# Patient Record
Sex: Male | Born: 1960 | Race: Asian | Hispanic: No | Marital: Married | Smoking: Never smoker
Health system: Southern US, Community
[De-identification: ages and names within clinical notes are randomized; demographics above are authoritative.]

## PROBLEM LIST (undated history)

## (undated) DIAGNOSIS — E785 Hyperlipidemia, unspecified: Secondary | ICD-10-CM

## (undated) DIAGNOSIS — K219 Gastro-esophageal reflux disease without esophagitis: Secondary | ICD-10-CM

## (undated) DIAGNOSIS — M5417 Radiculopathy, lumbosacral region: Secondary | ICD-10-CM

## (undated) DIAGNOSIS — N400 Enlarged prostate without lower urinary tract symptoms: Secondary | ICD-10-CM

## (undated) DIAGNOSIS — E119 Type 2 diabetes mellitus without complications: Secondary | ICD-10-CM

## (undated) DIAGNOSIS — K760 Fatty (change of) liver, not elsewhere classified: Secondary | ICD-10-CM

## (undated) DIAGNOSIS — I10 Essential (primary) hypertension: Secondary | ICD-10-CM

## (undated) DIAGNOSIS — L209 Atopic dermatitis, unspecified: Secondary | ICD-10-CM

---

## 2004-05-16 ENCOUNTER — Emergency Department: Payer: Self-pay | Admitting: General Practice

## 2004-05-16 ENCOUNTER — Other Ambulatory Visit: Payer: Self-pay

## 2010-10-24 ENCOUNTER — Emergency Department: Payer: Self-pay | Admitting: Unknown Physician Specialty

## 2013-04-14 ENCOUNTER — Ambulatory Visit: Payer: Self-pay | Admitting: Gastroenterology

## 2016-03-22 DIAGNOSIS — E785 Hyperlipidemia, unspecified: Secondary | ICD-10-CM

## 2016-03-22 HISTORY — DX: Hyperlipidemia, unspecified: E78.5

## 2016-12-08 ENCOUNTER — Other Ambulatory Visit: Payer: Self-pay | Admitting: Nurse Practitioner

## 2016-12-08 DIAGNOSIS — R140 Abdominal distension (gaseous): Secondary | ICD-10-CM

## 2016-12-08 DIAGNOSIS — R6881 Early satiety: Secondary | ICD-10-CM

## 2016-12-19 ENCOUNTER — Encounter: Admission: RE | Admit: 2016-12-19 | Payer: BLUE CROSS/BLUE SHIELD | Source: Ambulatory Visit

## 2017-01-18 ENCOUNTER — Encounter
Admission: RE | Admit: 2017-01-18 | Discharge: 2017-01-18 | Disposition: A | Discharge status: Home / Self Care | Payer: BLUE CROSS/BLUE SHIELD | Source: Ambulatory Visit | Attending: Nurse Practitioner | Admitting: Nurse Practitioner

## 2017-01-18 DIAGNOSIS — R6881 Early satiety: Secondary | ICD-10-CM | POA: Diagnosis present

## 2017-01-18 DIAGNOSIS — R140 Abdominal distension (gaseous): Secondary | ICD-10-CM | POA: Diagnosis present

## 2017-01-18 MED ORDER — TECHNETIUM TC 99M SULFUR COLLOID: 2.3500 | Freq: Once | INTRAVENOUS | Status: DC | PRN

## 2017-01-18 MED ORDER — TECHNETIUM TC 99M SULFUR COLLOID
2.0000 | Freq: Once | INTRAVENOUS | Status: AC | PRN
Start: 2017-01-18 — End: 2017-01-18
  Administered 2017-01-18: 2.35 via ORAL

## 2017-03-14 ENCOUNTER — Ambulatory Visit: Payer: BLUE CROSS/BLUE SHIELD | Admitting: Anesthesiology

## 2017-03-14 ENCOUNTER — Encounter
Admission: RE | Disposition: A | Discharge status: Home / Self Care | Payer: Self-pay | Source: Ambulatory Visit | Attending: Unknown Physician Specialty

## 2017-03-14 ENCOUNTER — Ambulatory Visit
Admission: RE | Admit: 2017-03-14 | Discharge: 2017-03-14 | Disposition: A | Discharge status: Home / Self Care | Payer: BLUE CROSS/BLUE SHIELD | Source: Ambulatory Visit | Attending: Unknown Physician Specialty | Admitting: Unknown Physician Specialty

## 2017-03-14 DIAGNOSIS — Z79899 Other long term (current) drug therapy: Secondary | ICD-10-CM | POA: Diagnosis not present

## 2017-03-14 DIAGNOSIS — K760 Fatty (change of) liver, not elsewhere classified: Secondary | ICD-10-CM | POA: Diagnosis not present

## 2017-03-14 DIAGNOSIS — K259 Gastric ulcer, unspecified as acute or chronic, without hemorrhage or perforation: Secondary | ICD-10-CM | POA: Diagnosis not present

## 2017-03-14 DIAGNOSIS — K449 Diaphragmatic hernia without obstruction or gangrene: Secondary | ICD-10-CM | POA: Diagnosis not present

## 2017-03-14 DIAGNOSIS — Z7984 Long term (current) use of oral hypoglycemic drugs: Secondary | ICD-10-CM | POA: Diagnosis not present

## 2017-03-14 DIAGNOSIS — E785 Hyperlipidemia, unspecified: Secondary | ICD-10-CM | POA: Diagnosis not present

## 2017-03-14 DIAGNOSIS — R1084 Generalized abdominal pain: Secondary | ICD-10-CM | POA: Diagnosis present

## 2017-03-14 DIAGNOSIS — K219 Gastro-esophageal reflux disease without esophagitis: Secondary | ICD-10-CM | POA: Diagnosis not present

## 2017-03-14 DIAGNOSIS — N400 Enlarged prostate without lower urinary tract symptoms: Secondary | ICD-10-CM | POA: Diagnosis not present

## 2017-03-14 DIAGNOSIS — M5416 Radiculopathy, lumbar region: Secondary | ICD-10-CM | POA: Insufficient documentation

## 2017-03-14 DIAGNOSIS — I10 Essential (primary) hypertension: Secondary | ICD-10-CM | POA: Diagnosis not present

## 2017-03-14 DIAGNOSIS — L308 Other specified dermatitis: Secondary | ICD-10-CM | POA: Diagnosis not present

## 2017-03-14 DIAGNOSIS — K2970 Gastritis, unspecified, without bleeding: Secondary | ICD-10-CM | POA: Insufficient documentation

## 2017-03-14 DIAGNOSIS — E119 Type 2 diabetes mellitus without complications: Secondary | ICD-10-CM | POA: Insufficient documentation

## 2017-03-14 HISTORY — DX: Benign prostatic hyperplasia without lower urinary tract symptoms: N40.0

## 2017-03-14 HISTORY — DX: Essential (primary) hypertension: I10

## 2017-03-14 HISTORY — DX: Gastro-esophageal reflux disease without esophagitis: K21.9

## 2017-03-14 HISTORY — DX: Fatty (change of) liver, not elsewhere classified: K76.0

## 2017-03-14 HISTORY — DX: Atopic dermatitis, unspecified: L20.9

## 2017-03-14 HISTORY — DX: Type 2 diabetes mellitus without complications: E11.9

## 2017-03-14 HISTORY — PX: ESOPHAGOGASTRODUODENOSCOPY (EGD) WITH PROPOFOL: SHX5813

## 2017-03-14 HISTORY — DX: Hyperlipidemia, unspecified: E78.5

## 2017-03-14 HISTORY — DX: Radiculopathy, lumbosacral region: M54.17

## 2017-03-14 LAB — GLUCOSE, CAPILLARY: GLUCOSE-CAPILLARY: 133 mg/dL — AB (ref 65–99)

## 2017-03-14 SURGERY — ESOPHAGOGASTRODUODENOSCOPY (EGD) WITH PROPOFOL
Anesthesia: General

## 2017-03-14 MED ORDER — LIDOCAINE HCL (PF) 2 % IJ SOLN
INTRAMUSCULAR | Status: AC
  Filled 2017-03-14: qty 2

## 2017-03-14 MED ORDER — GLYCOPYRROLATE 0.2 MG/ML IJ SOLN
INTRAMUSCULAR | Status: AC
  Filled 2017-03-14: qty 1

## 2017-03-14 MED ORDER — PROPOFOL 10 MG/ML IV BOLUS
INTRAVENOUS | Status: AC
  Filled 2017-03-14: qty 20

## 2017-03-14 MED ORDER — PROPOFOL 500 MG/50ML IV EMUL
INTRAVENOUS | Status: DC | PRN
  Administered 2017-03-14: 75 ug/kg/min via INTRAVENOUS

## 2017-03-14 MED ORDER — SODIUM CHLORIDE 0.9 % IV SOLN
INTRAVENOUS | Status: DC
  Administered 2017-03-14: 1000 mL via INTRAVENOUS

## 2017-03-14 MED ORDER — GLYCOPYRROLATE 0.2 MG/ML IJ SOLN
INTRAMUSCULAR | Status: DC | PRN
  Administered 2017-03-14: 0.2 mg via INTRAVENOUS

## 2017-03-14 MED ORDER — LIDOCAINE HCL (PF) 2 % IJ SOLN
INTRAMUSCULAR | Status: DC | PRN
  Administered 2017-03-14: 50 mg

## 2017-03-14 MED ORDER — MIDAZOLAM HCL 2 MG/2ML IJ SOLN
INTRAMUSCULAR | Status: AC
  Filled 2017-03-14: qty 2

## 2017-03-14 MED ORDER — SODIUM CHLORIDE 0.9 % IV SOLN: INTRAVENOUS | Status: DC

## 2017-03-14 MED ORDER — MIDAZOLAM HCL 5 MG/5ML IJ SOLN
INTRAMUSCULAR | Status: DC | PRN
  Administered 2017-03-14: 2 mg via INTRAVENOUS

## 2017-03-14 MED ORDER — FENTANYL CITRATE (PF) 100 MCG/2ML IJ SOLN
INTRAMUSCULAR | Status: DC | PRN
  Administered 2017-03-14 (×2): 50 ug via INTRAVENOUS

## 2017-03-14 MED ORDER — PROPOFOL 10 MG/ML IV BOLUS
INTRAVENOUS | Status: DC | PRN
  Administered 2017-03-14: 50 mg via INTRAVENOUS

## 2017-03-14 MED ORDER — FENTANYL CITRATE (PF) 100 MCG/2ML IJ SOLN
INTRAMUSCULAR | Status: AC
  Filled 2017-03-14: qty 2

## 2017-03-14 NOTE — Anesthesia Preprocedure Evaluation (Signed)
Anesthesia Evaluation  Patient identified by MRN, date of birth, ID band Patient awake    Reviewed: Allergy & Precautions, NPO status , Patient's Chart, lab work & pertinent test results  History of Anesthesia Complications Negative for: history of anesthetic complications  Airway Mallampati: II       Dental   Pulmonary neg COPD,           Cardiovascular hypertension, Pt. on medications      Neuro/Psych neg Seizures    GI/Hepatic Neg liver ROS, GERD  Medicated,  Endo/Other  diabetes, Type 2, Oral Hypoglycemic Agents  Renal/GU negative Renal ROS     Musculoskeletal   Abdominal   Peds  Hematology   Anesthesia Other Findings   Reproductive/Obstetrics                             Anesthesia Physical Anesthesia Plan  ASA: III  Anesthesia Plan: General   Post-op Pain Management:    Induction:   PONV Risk Score and Plan:   Airway Management Planned:   Additional Equipment:   Intra-op Plan:   Post-operative Plan:   Informed Consent: I have reviewed the patients History and Physical, chart, labs and discussed the procedure including the risks, benefits and alternatives for the proposed anesthesia with the patient or authorized representative who has indicated his/her understanding and acceptance.     Plan Discussed with:   Anesthesia Plan Comments:         Anesthesia Quick Evaluation

## 2017-03-14 NOTE — Transfer of Care (Signed)
Immediate Anesthesia Transfer of Care Note  Patient: Marbin Remick  Procedure(s) Performed: Procedure(s): ESOPHAGOGASTRODUODENOSCOPY (EGD) WITH PROPOFOL (N/A)  Patient Location: PACU  Anesthesia Type:General  Level of Consciousness: sedated  Airway & Oxygen Therapy: Patient Spontanous Breathing and Patient connected to nasal cannula oxygen  Post-op Assessment: Report given to RN and Post -op Vital signs reviewed and stable  Post vital signs: Reviewed and stable  Last Vitals:  Vitals:   03/14/17 0814  BP: (!) 151/90  Pulse: (!) 58  Resp: 16  Temp: (!) 36.2 C  SpO2: 100%    Last Pain:  Vitals:   03/14/17 0814  TempSrc: Tympanic         Complications: No apparent anesthesia complications

## 2017-03-14 NOTE — Anesthesia Postprocedure Evaluation (Signed)
Anesthesia Post Note  Patient: Cezar Croker  Procedure(s) Performed: Procedure(s) (LRB): ESOPHAGOGASTRODUODENOSCOPY (EGD) WITH PROPOFOL (N/A)  Patient location during evaluation: Endoscopy Anesthesia Type: General Level of consciousness: awake and alert Pain management: pain level controlled Vital Signs Assessment: post-procedure vital signs reviewed and stable Respiratory status: spontaneous breathing and respiratory function stable Cardiovascular status: stable Anesthetic complications: no     Last Vitals:  Vitals:   03/14/17 0814  BP: (!) 151/90  Pulse: (!) 58  Resp: 16  Temp: (!) 36.2 C  SpO2: 100%    Last Pain:  Vitals:   03/14/17 0814  TempSrc: Tympanic                 KEPHART,WILLIAM K

## 2017-03-14 NOTE — Anesthesia Post-op Follow-up Note (Signed)
Anesthesia QCDR form completed.        

## 2017-03-14 NOTE — H&P (Signed)
   Primary Care Physician:  [facility] Primary Gastroenterologist:  Dr. Mechele CollinElliott  Pre-Procedure History & Physical: HPI:  Robert Avery is a 56 y.o. male is here for an endoscopy.   Past Medical History:  Diagnosis Date  . Atopic dermatitis   . BPH (benign prostatic hyperplasia)   . Diabetes mellitus without complication (HCC)   . Fatty liver   . GERD (gastroesophageal reflux disease)   . Hyperlipidemia 03/22/2016  . Hypertension   . Lumbosacral radiculopathy at L4     No past surgical history on file.  Prior to Admission medications   Medication Sig Start Date End Date Taking? Authorizing Provider  metFORMIN (GLUCOPHAGE) 500 MG tablet Take by mouth 2 (two) times daily with a meal.   Yes [provider]  pantoprazole (PROTONIX) 40 MG tablet Take 40 mg by mouth 2 (two) times daily.   Yes [provider]  rosuvastatin (CRESTOR) 5 MG tablet Take 5 mg by mouth daily.   Yes [provider]  valsartan-hydrochlorothiazide (DIOVAN-HCT) 320-25 MG tablet Take 1 tablet by mouth daily.   Yes [provider]    Allergies as of 12/28/2016  . (Not on File)    No family history on file.  Social History   Social History  . Marital status: Married    Spouse name: N/A  . Number of children: N/A  . Years of education: N/A   Occupational History  . Not on file.   Social History Main Topics  . Smoking status: Never Smoker  . Smokeless tobacco: Never Used  . Alcohol use No  . Drug use: No  . Sexual activity: Not on file   Other Topics Concern  . Not on file   Social History Narrative  . No narrative on file    Review of Systems: See HPI, otherwise negative ROS  Physical Exam: BP (!) 151/90   Pulse (!) 58   Temp (!) 97.1 F (36.2 C) (Tympanic)   Resp 16   Ht 5\' 7"  (1.702 m)   Wt 78 kg (172 lb)   SpO2 100%   BMI 26.94 kg/m  General:   Alert,  pleasant and cooperative in NAD Head:  Normocephalic and atraumatic. Neck:   Supple; no masses or thyromegaly. Lungs:  Clear throughout to auscultation.    Heart:  Regular rate and rhythm. Abdomen:  Soft, nontender and nondistended. Normal bowel sounds, without guarding, and without rebound.   Neurologic:  Alert and  oriented x4;  grossly normal neurologically.  Impression/Plan: Robert Avery is here for an endoscopy to be performed for epigastric abd pain.  Risks, benefits, limitations, and alternatives regarding  endoscopy have been reviewed with the patient.  Questions have been answered.  All parties agreeable.   Lynnae PrudeELLIOTT, [name], MD  03/14/2017, 8:36 AM

## 2017-03-14 NOTE — Op Note (Signed)
[facility] Gastroenterology Patient Name: Robert Avery Procedure Date: 03/14/2017 8:37 AM MRN: [id number] Account #: [id number] Date of Birth: 01/12/1961 Admit Type: Outpatient Age: [age] [facility] 3 Gender: Male Note Status: Finalized Procedure:            Upper GI endoscopy Indications:          Epigastric abdominal pain, Generalized abdominal pain Providers:            Scot Junobert T. Elliott, MD Referring MD:         No Local Md, MD (Referring MD) Medicines:            Propofol per Anesthesia Complications:        No immediate complications. Procedure:            Pre-Anesthesia Assessment:                       - After reviewing the risks and benefits, the patient                        was deemed in satisfactory condition to undergo the                        procedure.                       After obtaining informed consent, the endoscope was                        passed under direct vision. Throughout the procedure,                        the patient's blood pressure, pulse, and oxygen                        saturations were monitored continuously. The Endoscope                        was introduced through the mouth, and advanced to the                        second part of duodenum. The upper GI endoscopy was                        accomplished without difficulty. The patient tolerated                        the procedure well. Findings:      A small hiatal hernia was present. Biopsies were taken with a cold       forceps for histology.      Diffuse and patchy mild inflammation characterized by erythema was found       in the gastric antrum. Biopsies were taken with a cold forceps for       histology. Biopsies were taken with a cold forceps for Helicobacter       pylori testing.      Localized minimal inflammation characterized by erythema and granularity       was found in the gastric body. Biopsies were taken with a cold forceps       for  histology. Biopsies were taken with a cold forceps for Helicobacter  pylori testing.      The examined duodenum was normal. Impression:           - Small hiatal hernia. Biopsied.                       - Gastritis. Biopsied.                       - Gastritis. Biopsied.                       - Normal examined duodenum. Recommendation:       - Await pathology results. Scot Jun, MD 03/14/2017 8:52:19 AM This report has been signed electronically. Number of Addenda: 0 Note Initiated On: 03/14/2017 8:37 AM      [facility]

## 2017-03-15 ENCOUNTER — Encounter: Payer: Self-pay | Admitting: Unknown Physician Specialty

## 2017-03-16 LAB — SURGICAL PATHOLOGY

## 2017-03-22 ENCOUNTER — Other Ambulatory Visit: Payer: Self-pay | Admitting: Nurse Practitioner

## 2017-03-22 DIAGNOSIS — R1013 Epigastric pain: Secondary | ICD-10-CM

## 2017-03-22 DIAGNOSIS — R140 Abdominal distension (gaseous): Secondary | ICD-10-CM

## 2017-03-28 ENCOUNTER — Ambulatory Visit: Admission: RE | Admit: 2017-03-28 | Payer: BLUE CROSS/BLUE SHIELD | Source: Ambulatory Visit

## 2017-04-05 ENCOUNTER — Ambulatory Visit
Admission: RE | Admit: 2017-04-05 | Discharge: 2017-04-05 | Disposition: A | Discharge status: Home / Self Care | Payer: BLUE CROSS/BLUE SHIELD | Source: Ambulatory Visit | Attending: Nurse Practitioner | Admitting: Nurse Practitioner

## 2017-04-05 ENCOUNTER — Ambulatory Visit: Admission: RE | Admit: 2017-04-05 | Payer: BLUE CROSS/BLUE SHIELD | Source: Ambulatory Visit

## 2017-04-05 DIAGNOSIS — K219 Gastro-esophageal reflux disease without esophagitis: Secondary | ICD-10-CM | POA: Diagnosis present

## 2017-04-05 DIAGNOSIS — R1013 Epigastric pain: Secondary | ICD-10-CM | POA: Insufficient documentation

## 2017-04-05 DIAGNOSIS — R160 Hepatomegaly, not elsewhere classified: Secondary | ICD-10-CM | POA: Diagnosis not present

## 2017-04-05 DIAGNOSIS — R140 Abdominal distension (gaseous): Secondary | ICD-10-CM | POA: Diagnosis present

## 2017-04-05 DIAGNOSIS — I700 Atherosclerosis of aorta: Secondary | ICD-10-CM | POA: Insufficient documentation

## 2017-04-05 DIAGNOSIS — N400 Enlarged prostate without lower urinary tract symptoms: Secondary | ICD-10-CM | POA: Diagnosis not present

## 2017-04-05 MED ORDER — IOPAMIDOL (ISOVUE-300) INJECTION 61%
100.0000 mL | Freq: Once | INTRAVENOUS | Status: AC | PRN
  Administered 2017-04-05: 100 mL via INTRAVENOUS

## 2017-06-15 ENCOUNTER — Encounter: Payer: Self-pay | Admitting: Urology

## 2017-06-15 ENCOUNTER — Ambulatory Visit: Payer: Self-pay | Admitting: Urology

## 2017-12-13 IMAGING — NM NM GASTRIC EMPTYING
1 series · 10 of 10 positions shown · non-contrast
Comparison: None

CLINICAL DATA: Early satiety, postprandial bloating

EXAM:
NUCLEAR MEDICINE GASTRIC EMPTYING SCAN
TECHNIQUE: After oral ingestion of radiolabeled meal, sequential abdominal
images were obtained for 4 hours. Percentage of activity emptying
the stomach was calculated at 1 hour, 2 hour, 3 hour, and 4 hours.
RADIOPHARMACEUTICALS:  2.35 mCi Ec-PPm sulfur colloid in
standardized meal

[Series 1000: gatric statics (results) · 3.90mm/px · 5 acquisitions, 10 frames shown]
[im 1/5]
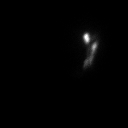
[im 1/5]
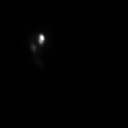
[im 2/5]
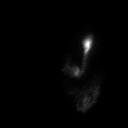
[im 2/5]
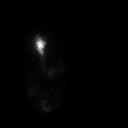
[im 3/5]
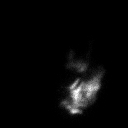
[im 3/5]
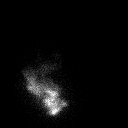
[im 4/5]
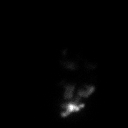
[im 4/5]
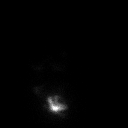
[im 5/5]
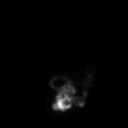
[im 5/5]
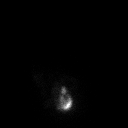

[10 of 10 positions shown; findings below may reference images not displayed]

FINDINGS: Expected location of the stomach in the left upper quadrant.

Ingested meal empties the stomach gradually over the course of the
study.

27% emptied at 1 hr ( normal >= 10%)

91% emptied at 2 hr ( normal >= 40%)

95% emptied at 3 hr ( normal >= 70%)

100% emptied at 4 hr ( normal >= 90%)
IMPRESSION: Normal gastric emptying.

## 2018-04-07 ENCOUNTER — Emergency Department
Admission: EM | Admit: 2018-04-07 | Discharge: 2018-04-07 | Disposition: A | Discharge status: Home / Self Care | Payer: BLUE CROSS/BLUE SHIELD | Attending: Emergency Medicine | Admitting: Emergency Medicine

## 2018-04-07 ENCOUNTER — Emergency Department: Payer: BLUE CROSS/BLUE SHIELD

## 2018-04-07 ENCOUNTER — Other Ambulatory Visit: Payer: Self-pay

## 2018-04-07 ENCOUNTER — Encounter: Payer: Self-pay | Admitting: Emergency Medicine

## 2018-04-07 DIAGNOSIS — Y990 Civilian activity done for income or pay: Secondary | ICD-10-CM | POA: Diagnosis not present

## 2018-04-07 DIAGNOSIS — S66822A Laceration of other specified muscles, fascia and tendons at wrist and hand level, left hand, initial encounter: Secondary | ICD-10-CM | POA: Insufficient documentation

## 2018-04-07 DIAGNOSIS — Y9259 Other trade areas as the place of occurrence of the external cause: Secondary | ICD-10-CM | POA: Insufficient documentation

## 2018-04-07 DIAGNOSIS — S66812A Strain of other specified muscles, fascia and tendons at wrist and hand level, left hand, initial encounter: Secondary | ICD-10-CM

## 2018-04-07 DIAGNOSIS — E119 Type 2 diabetes mellitus without complications: Secondary | ICD-10-CM | POA: Diagnosis not present

## 2018-04-07 DIAGNOSIS — I10 Essential (primary) hypertension: Secondary | ICD-10-CM | POA: Diagnosis not present

## 2018-04-07 DIAGNOSIS — W208XXA Other cause of strike by thrown, projected or falling object, initial encounter: Secondary | ICD-10-CM | POA: Diagnosis not present

## 2018-04-07 DIAGNOSIS — Z23 Encounter for immunization: Secondary | ICD-10-CM | POA: Insufficient documentation

## 2018-04-07 DIAGNOSIS — Z79899 Other long term (current) drug therapy: Secondary | ICD-10-CM | POA: Diagnosis not present

## 2018-04-07 DIAGNOSIS — Y93E9 Activity, other interior property and clothing maintenance: Secondary | ICD-10-CM | POA: Diagnosis not present

## 2018-04-07 DIAGNOSIS — Z7984 Long term (current) use of oral hypoglycemic drugs: Secondary | ICD-10-CM | POA: Diagnosis not present

## 2018-04-07 DIAGNOSIS — S61412A Laceration without foreign body of left hand, initial encounter: Secondary | ICD-10-CM | POA: Diagnosis present

## 2018-04-07 MED ORDER — MORPHINE SULFATE (PF) 4 MG/ML IV SOLN
4.0000 mg | Freq: Once | INTRAVENOUS | Status: AC
  Administered 2018-04-07: 4 mg via INTRAVENOUS
  Filled 2018-04-07: qty 1

## 2018-04-07 MED ORDER — LIDOCAINE-EPINEPHRINE (PF) 1 %-1:200000 IJ SOLN
10.0000 mL | Freq: Once | INTRAMUSCULAR | Status: DC
  Filled 2018-04-07: qty 10

## 2018-04-07 MED ORDER — MORPHINE SULFATE (PF) 4 MG/ML IV SOLN
INTRAVENOUS | Status: AC
  Filled 2018-04-07: qty 1

## 2018-04-07 MED ORDER — MORPHINE SULFATE (PF) 4 MG/ML IV SOLN: 4.0000 mg | Freq: Once | INTRAVENOUS | Status: DC

## 2018-04-07 MED ORDER — TETANUS-DIPHTH-ACELL PERTUSSIS 5-2.5-18.5 LF-MCG/0.5 IM SUSP
INTRAMUSCULAR | Status: AC
  Filled 2018-04-07: qty 0.5

## 2018-04-07 MED ORDER — TRAMADOL HCL 50 MG PO TABS: 50.0000 mg | ORAL_TABLET | Freq: Four times a day (QID) | ORAL | 0 refills | Status: AC | PRN

## 2018-04-07 MED ORDER — TETANUS-DIPHTH-ACELL PERTUSSIS 5-2.5-18.5 LF-MCG/0.5 IM SUSP
0.5000 mL | Freq: Once | INTRAMUSCULAR | Status: AC
  Administered 2018-04-07: 0.5 mL via INTRAMUSCULAR
  Filled 2018-04-07: qty 0.5

## 2018-04-07 MED ORDER — LIDOCAINE-EPINEPHRINE 1 %-1:100000 IJ SOLN
INTRAMUSCULAR | Status: AC
  Filled 2018-04-07: qty 1

## 2018-04-07 MED ORDER — TRAMADOL HCL 50 MG PO TABS
50.0000 mg | ORAL_TABLET | Freq: Once | ORAL | Status: AC
  Administered 2018-04-07: 50 mg via ORAL
  Filled 2018-04-07: qty 1

## 2018-04-07 MED ORDER — ONDANSETRON HCL 4 MG/2ML IJ SOLN
INTRAMUSCULAR | Status: AC
  Filled 2018-04-07: qty 2

## 2018-04-07 MED ORDER — ONDANSETRON HCL 4 MG/2ML IJ SOLN
4.0000 mg | Freq: Once | INTRAMUSCULAR | Status: AC
  Administered 2018-04-07: 4 mg via INTRAVENOUS
  Filled 2018-04-07: qty 2

## 2018-04-07 NOTE — ED Provider Notes (Signed)
[facility] EMERGENCY DEPARTMENT Provider Note   CSN: [id number] Arrival date & time: 04/07/18  1529     History   Chief Complaint Chief Complaint  Patient presents with  . Arm Injury    HPI Robert Avery is a 57 y.o. male.  Presents to the emergency department for a workers comp injury to the left hand.  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  Patient denies any numbness or tingling but is unable to extend the left thumb.  His pain is moderate.  Tetanus status is unknown.  He is right-hand dominant.  Patient denies any other injury to his body.  Denies falling, hitting his head or losing consciousness.  HPI  Past Medical History:  Diagnosis Date  . Atopic dermatitis   . BPH (benign prostatic hyperplasia)   . Diabetes mellitus without complication (HCC)    Pt takes Metformin  . Fatty liver   . GERD (gastroesophageal reflux disease)   . Hyperlipidemia 03/22/2016  . Hypertension   . Lumbosacral radiculopathy at L4     There are no active problems to display for this patient.   Past Surgical History:  Procedure Laterality Date  . ESOPHAGOGASTRODUODENOSCOPY (EGD) WITH PROPOFOL N/A 03/14/2017   Procedure: ESOPHAGOGASTRODUODENOSCOPY (EGD) WITH PROPOFOL;  Surgeon: Scot Jun, MD;  Location: [facility] ENDOSCOPY;  Service: Endoscopy;  Laterality: N/A;        Home Medications    Prior to Admission medications   Medication Sig Start Date End Date Taking? Authorizing Provider  metFORMIN (GLUCOPHAGE) 500 MG tablet Take by mouth 2 (two) times daily with a meal.    [provider]  pantoprazole (PROTONIX) 40 MG tablet Take 40 mg by mouth 2 (two) times daily.    [provider]  rosuvastatin (CRESTOR) 5 MG tablet Take 5 mg by mouth daily.    [provider]  traMADol (ULTRAM) 50 MG tablet Take 1 tablet (50 mg  total) by mouth every 6 (six) hours as needed. 04/07/18 04/07/19  Evon Slack, PA-C  valsartan-hydrochlorothiazide (DIOVAN-HCT) 320-25 MG tablet Take 1 tablet by mouth daily.    [provider]    Family History No family history on file.  Social History Social History   Tobacco Use  . Smoking status: Never Smoker  . Smokeless tobacco: Never Used  Substance Use Topics  . Alcohol use: No  . Drug use: No     Allergies   Patient has no known allergies.   Review of Systems Review of Systems  Constitutional: Positive for fever.  Respiratory: Negative for shortness of breath.   Cardiovascular: Negative for chest pain.  Musculoskeletal: Positive for arthralgias and myalgias. Negative for gait problem, joint swelling and neck pain.  Skin: Positive for wound.  Neurological: Negative for numbness and headaches.     Physical Exam Updated Vital Signs BP (!) 169/96 (BP Location: Right Arm)   Pulse 76   Resp 14   SpO2 99%   Physical Exam  Constitutional: He is oriented to person, place, and time. He appears well-developed and well-nourished.  HENT:  Head: Normocephalic and atraumatic.  Eyes: Conjunctivae are normal.  Neck: Normal range of motion.  Cardiovascular: Normal rate.  Pulmonary/Chest: Effort normal. No respiratory distress.  Musculoskeletal:  Examination of the left hand shows patient has a 3 cm laceration along the radial styloid of the left  wrist extending towards the dorsal aspect of the radiocarpal joint.  No visible or palpable foreign body.  Distal portion of the extensor pollicis tendon is visualized, unable to visualized more proximal portion.  Laceration is linear.  Sensation is intact distally.  No other tendon deficits noted.  Neurological: He is alert and oriented to person, place, and time.  Skin: Skin is warm. No rash noted.  Psychiatric: He has a normal mood and affect. His behavior is normal. Thought content normal.     ED Treatments /  Results  Labs (all labs ordered are listed, but only abnormal results are displayed) Labs Reviewed - No data to display  EKG None  Radiology Dg Hand Complete Left  Result Date: 04/07/2018 CLINICAL DATA:  Metal she fell onto left hand. Left hand pain and laceration. Initial encounter. EXAM: LEFT HAND - COMPLETE 3+ VIEW COMPARISON:  None. FINDINGS: There is no evidence of fracture or dislocation. There is no evidence of arthropathy or other focal bone abnormality. No evidence of radiopaque foreign body. IMPRESSION: No evidence of fracture or radiopaque foreign body. Electronically Signed   By: Myles Rosenthal M.D.   On: 04/07/2018 16:18    Procedures .Marland KitchenLaceration Repair Date/Time: 04/07/2018 5:05 PM Performed by: Evon Slack, PA-C Authorized by: Evon Slack, PA-C   Consent:    Consent obtained:  Verbal   Consent given by:  Patient   Risks discussed:  Infection and tendon damage   Alternatives discussed:  No treatment Anesthesia (see MAR for exact dosages):    Anesthesia method:  Local infiltration   Local anesthetic:  Lidocaine 1% WITH epi Laceration details:    Location:  Hand   Hand location:  L wrist   Length (cm):  3   Depth (mm):  3 Repair type:    Repair type:  Simple Pre-procedure details:    Preparation:  Patient was prepped and draped in usual sterile fashion Exploration:    Wound exploration: wound explored through full range of motion and entire depth of wound probed and visualized     Wound extent: tendon damage     Tendon damage location:  Upper extremity   Tendon damage extent:  Complete transection   Tendon repair plan:  Refer for evaluation   Contaminated: no (Extensor pollicis tendon left thumb)   Treatment:    Area cleansed with:  Betadine and saline   Amount of cleaning:  Extensive   Irrigation volume:  120   Irrigation method:  Pressure wash Skin repair:    Repair method:  Sutures   Suture size:  5-0   Suture material:  Nylon Approximation:     Approximation:  Close Post-procedure details:    Dressing:  Non-adherent dressing, bulky dressing and splint for protection   Patient tolerance of procedure:  Tolerated well, no immediate complications .Splint Application Date/Time: 04/07/2018 5:07 PM Performed by: Evon Slack, PA-C Authorized by: Evon Slack, PA-C   Consent:    Consent obtained:  Verbal   Consent given by:  Patient   Risks discussed:  Numbness, pain and swelling Pre-procedure details:    Sensation:  Normal Procedure details:    Laterality:  Left   Location:  Wrist   Cast type:  Short arm   Splint type:  Thumb spica   Supplies:  Elastic bandage, Ortho-Glass and cotton padding Post-procedure details:    Pain:  Unchanged   Sensation:  Normal   Patient tolerance of procedure:  Tolerated well, no immediate complications   (  including critical care time)  Medications Ordered in ED Medications  lidocaine-EPINEPHrine (XYLOCAINE-EPINEPHrine) 1 %-1:200000 (PF) injection 10 mL (has no administration in time range)  morphine 4 MG/ML injection (has no administration in time range)  ondansetron (ZOFRAN) 4 MG/2ML injection (has no administration in time range)  Tdap (BOOSTRIX) 5-2.5-18.5 LF-MCG/0.5 injection (has no administration in time range)  lidocaine-EPINEPHrine (XYLOCAINE W/EPI) 1 %-1:100000 (with pres) injection (has no administration in time range)  ondansetron (ZOFRAN) injection 4 mg (4 mg Intravenous Given 04/07/18 1550)  Tdap (BOOSTRIX) injection 0.5 mL (0.5 mLs Intramuscular Given 04/07/18 1551)  morphine 4 MG/ML injection 4 mg (4 mg Intravenous Given 04/07/18 1550)     Initial Impression / Assessment and Plan / ED Course  I have reviewed the triage vital signs and the nursing notes.  Pertinent labs & imaging results that were available during my care of the patient were reviewed by me and considered in my medical decision making (see chart for details).     57 year old male with laceration to the  left wrist with evidence of extensor pollicis tendon rupture.  Discussed with orthopedics who recommend following up with hand surgeon Dr.Soria.  Laceration site is thoroughly irrigated and repaired with sutures and patient placed into a thumb spica splint.  Patient given tramadol for moderate to severe pain.  Final Clinical Impressions(s) / ED Diagnoses   Final diagnoses:  Laceration of left hand without foreign body, initial encounter  Extensor tendon rupture of hand, left, initial encounter    ED Discharge Orders         Ordered    traMADol (ULTRAM) 50 MG tablet  Every 6 hours PRN     04/07/18 1652           Evon Slack, PA-C 04/07/18 1709    Emily Filbert, MD 04/07/18 1740

## 2018-04-07 NOTE — Discharge Instructions (Addendum)
Today you suffered a laceration to the extensor tendon of your left thumb.  X-ray showed no foreign bodies and no broken bones.  Please call hand surgeon tomorrow to schedule a follow-up appointment.  Keep splint on at all times and do not get wet.  Take Tylenol and ibuprofen as needed for mild to moderate pain.  If pain is severe meet you may take tramadol as prescribed.

## 2018-04-07 NOTE — ED Triage Notes (Addendum)
PT c/o lac to LFT hand after metal sheet fell onto lft hand./ Per first RN pressure applied in lobby and sent back to room. Bleeding controlled, open wound noted, unable to extend thumb. Provider at bedside

## 2019-02-20 ENCOUNTER — Other Ambulatory Visit: Payer: Self-pay

## 2019-02-20 ENCOUNTER — Encounter: Payer: Self-pay | Admitting: Emergency Medicine

## 2019-02-20 DIAGNOSIS — I10 Essential (primary) hypertension: Secondary | ICD-10-CM | POA: Diagnosis not present

## 2019-02-20 DIAGNOSIS — Y939 Activity, unspecified: Secondary | ICD-10-CM | POA: Insufficient documentation

## 2019-02-20 DIAGNOSIS — Y999 Unspecified external cause status: Secondary | ICD-10-CM | POA: Insufficient documentation

## 2019-02-20 DIAGNOSIS — X102XXA Contact with fats and cooking oils, initial encounter: Secondary | ICD-10-CM | POA: Diagnosis not present

## 2019-02-20 DIAGNOSIS — T25211A Burn of second degree of right ankle, initial encounter: Secondary | ICD-10-CM | POA: Insufficient documentation

## 2019-02-20 DIAGNOSIS — T25011A Burn of unspecified degree of right ankle, initial encounter: Secondary | ICD-10-CM | POA: Diagnosis present

## 2019-02-20 DIAGNOSIS — Z7984 Long term (current) use of oral hypoglycemic drugs: Secondary | ICD-10-CM | POA: Diagnosis not present

## 2019-02-20 DIAGNOSIS — Y929 Unspecified place or not applicable: Secondary | ICD-10-CM | POA: Insufficient documentation

## 2019-02-20 DIAGNOSIS — Z79899 Other long term (current) drug therapy: Secondary | ICD-10-CM | POA: Diagnosis not present

## 2019-02-20 DIAGNOSIS — E119 Type 2 diabetes mellitus without complications: Secondary | ICD-10-CM | POA: Insufficient documentation
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
## 2019-02-21 ENCOUNTER — Emergency Department
Admission: EM | Admit: 2019-02-21 | Discharge: 2019-02-21 | Disposition: A | Discharge status: Home / Self Care | Payer: BLUE CROSS/BLUE SHIELD | Attending: Emergency Medicine | Admitting: Emergency Medicine

## 2019-02-21 DIAGNOSIS — T25211A Burn of second degree of right ankle, initial encounter: Secondary | ICD-10-CM

## 2019-02-21 MED ORDER — IBUPROFEN 600 MG PO TABS
600.0000 mg | ORAL_TABLET | Freq: Once | ORAL | Status: AC
  Administered 2019-02-21: 600 mg via ORAL
  Filled 2019-02-21: qty 1

## 2019-02-21 MED ORDER — SILVER SULFADIAZINE 1 % EX CREA: TOPICAL_CREAM | CUTANEOUS | 1 refills | Status: AC

## 2019-02-21 MED ORDER — OXYCODONE-ACETAMINOPHEN 5-325 MG PO TABS: 1.0000 | ORAL_TABLET | ORAL | 0 refills | Status: DC | PRN

## 2019-02-21 NOTE — ED Notes (Signed)
Dry dressing applied to ankle per md verbal order.  meds given.
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
## 2019-02-21 NOTE — ED Provider Notes (Signed)
[facility] Emergency Department Provider Note  ____________________________________________  Time seen: Approximately 1:59 AM  I have reviewed the triage vital signs and the nursing notes.   HISTORY  Chief Complaint Burn   HPI Mitesh Avery is a 58 y.o. male With history as listed below presents for evaluation of burn to the right ankle.  Patient reports dropping hot oil on his ankle 2 days ago.  Patient reports that a blister formed immediately however that is getting bigger.  He is complaining of 6 out of 10 burning constant pain that is worse with weightbearing.  No other burns.  Last tetanus shot was in 2019.  Past Medical History:  Diagnosis Date  . Atopic dermatitis   . BPH (benign prostatic hyperplasia)   . Diabetes mellitus without complication ([place])    Pt takes Metformin  . Fatty liver   . GERD (gastroesophageal reflux disease)   . Hyperlipidemia 03/22/2016  . Hypertension   . Lumbosacral radiculopathy at L4     There are no active problems to display for this patient.   Past Surgical History:  Procedure Laterality Date  . ESOPHAGOGASTRODUODENOSCOPY (EGD) WITH PROPOFOL N/A 03/14/2017   Procedure: ESOPHAGOGASTRODUODENOSCOPY (EGD) WITH PROPOFOL;  Surgeon: Manya Silvas, MD;  Location: [facility] ENDOSCOPY;  Service: Endoscopy;  Laterality: N/A;    Prior to Admission medications   Medication Sig Start Date End Date Taking? Authorizing Provider  metFORMIN (GLUCOPHAGE) 500 MG tablet Take by mouth 2 (two) times daily with a meal.    [provider]  oxyCODONE-acetaminophen (PERCOCET) 5-325 MG tablet Take 1 tablet by mouth every 4 (four) hours as needed. 02/21/19   Rudene Re, MD  pantoprazole (PROTONIX) 40 MG tablet Take 40 mg by mouth 2 (two) times daily.    [provider]  rosuvastatin (CRESTOR) 5 MG tablet Take 5 mg by mouth daily.    [provider]  silver sulfADIAZINE (SILVADENE) 1 % cream Apply to  affected area daily 02/21/19 02/21/20  Alfred Levins, [place], MD  traMADol (ULTRAM) 50 MG tablet Take 1 tablet (50 mg total) by mouth every 6 (six) hours as needed. 04/07/18 04/07/19  Duanne Guess, PA-C  valsartan-hydrochlorothiazide (DIOVAN-HCT) 320-25 MG tablet Take 1 tablet by mouth daily.    [provider]    Allergies Patient has no known allergies.  No family history on file.  Social History Social History   Tobacco Use  . Smoking status: Never Smoker  . Smokeless tobacco: Never Used  Substance Use Topics  . Alcohol use: No  . Drug use: No    Review of Systems  Constitutional: Negative for fever. Eyes: Negative for visual changes. ENT: Negative for sore throat. Neck: No neck pain  Cardiovascular: Negative for chest pain. Respiratory: Negative for shortness of breath. Gastrointestinal: Negative for abdominal pain, vomiting or diarrhea. Genitourinary: Negative for dysuria. Musculoskeletal: Negative for back pain. Skin: Negative for rash. + burn to ankle Neurological: Negative for headaches, weakness or numbness. Psych: No SI or HI  ____________________________________________   PHYSICAL EXAM:  VITAL SIGNS: ED Triage Vitals  Enc Vitals Group     BP 02/20/19 2316 (!) 151/82     Pulse Rate 02/20/19 2316 75     Resp 02/20/19 2316 18     Temp 02/20/19 2316 98.6 F (37 C)     Temp Source 02/20/19 2316 Oral     SpO2 02/20/19 2316 96 %     Weight 02/20/19 2314 168 lb (76.2 kg)  Height 02/20/19 2314 5\' 7"  (1.702 m)     Head Circumference --      Peak Flow --      Pain Score 02/20/19 2314 6     Pain Loc --      Pain Edu? --      Excl. in GC? --     Constitutional: Alert and oriented. Well appearing and in no apparent distress. HEENT:      Head: Normocephalic and atraumatic.         Eyes: Conjunctivae are normal. Sclera is non-icteric.       Mouth/Throat: Mucous membranes are moist.       Neck: Supple with no signs of meningismus. Cardiovascular:  Regular rate and rhythm.  Respiratory: Normal respiratory effort.  Musculoskeletal: 2nd degree burn to the inner R ankle with fluid filled blister. Non circumferential, strong distal pulses and brisk cap refill Neurologic: Normal speech and language. Face is symmetric. Moving all extremities. No gross focal neurologic deficits are appreciated. Skin: Skin is warm, dry and intact. No rash noted. Psychiatric: Mood and affect are normal. Speech and behavior are normal.  ____________________________________________   LABS (all labs ordered are listed, but only abnormal results are displayed)  Labs Reviewed - No data to display ____________________________________________  EKG  none  ____________________________________________  RADIOLOGY  none  ____________________________________________   PROCEDURES  Procedure(s) performed: None Procedures Critical Care performed:  None ____________________________________________   INITIAL IMPRESSION / ASSESSMENT AND PLAN / ED COURSE   58 y.o. male With history as listed below presents for evaluation of 2nd degree hot oil burn to the right ankle x 2 days.  Patient with a non-circumferential right ankle burn. Fluid was drained but skin kept intact for extra protection. Wound was dressed. Recommended applying silvadene once blister pops, keeping wound clean and dry, daily dry dressing changes, and f/u with [facility]. Tetanus up to date.       As part of my medical decision making, I reviewed the following data within the electronic MEDICAL RECORD NUMBER Nursing notes reviewed and incorporated, Old chart reviewed, Notes from prior ED visits and [place] Controlled Substance Database   Patient was evaluated in Emergency Department today for the symptoms described in the history of present illness. Patient was evaluated in the context of the global COVID-19 pandemic, which necessitated consideration that the patient might be at risk for infection with the  SARS-CoV-2 virus that causes COVID-19. Institutional protocols and algorithms that pertain to the evaluation of patients at risk for COVID-19 are in a state of rapid change based on information released by regulatory bodies including the CDC and federal and state organizations. These policies and algorithms were followed during the patient's care in the ED.   ____________________________________________   FINAL CLINICAL IMPRESSION(S) / ED DIAGNOSES   Final diagnoses:  Partial thickness burn of right ankle, initial encounter      NEW MEDICATIONS STARTED DURING THIS VISIT:  ED Discharge Orders         Ordered    silver sulfADIAZINE (SILVADENE) 1 % cream     02/21/19 0157    oxyCODONE-acetaminophen (PERCOCET) 5-325 MG tablet  Every 4 hours PRN     02/21/19 0157           Note:  This document was prepared using Dragon voice recognition software and may include unintentional dictation errors.    Don PerkingVeronese, [place], MD 02/21/19 [phone]

## 2019-02-21 NOTE — ED Notes (Signed)
Pt has large blister to right inner ankle area.  Pt spilled hot oil on leg 2 days ago.  Large blister to ankle area.  md at bedside.

## 2019-03-02 IMAGING — DX DG HAND COMPLETE 3+V*L*
3 series · 3 of 3 positions shown · non-contrast
Comparison: None.

CLINICAL DATA: Metal she fell onto left hand. Left hand pain and
laceration. Initial encounter.

EXAM:
LEFT HAND - COMPLETE 3+ VIEW

[hand ap]
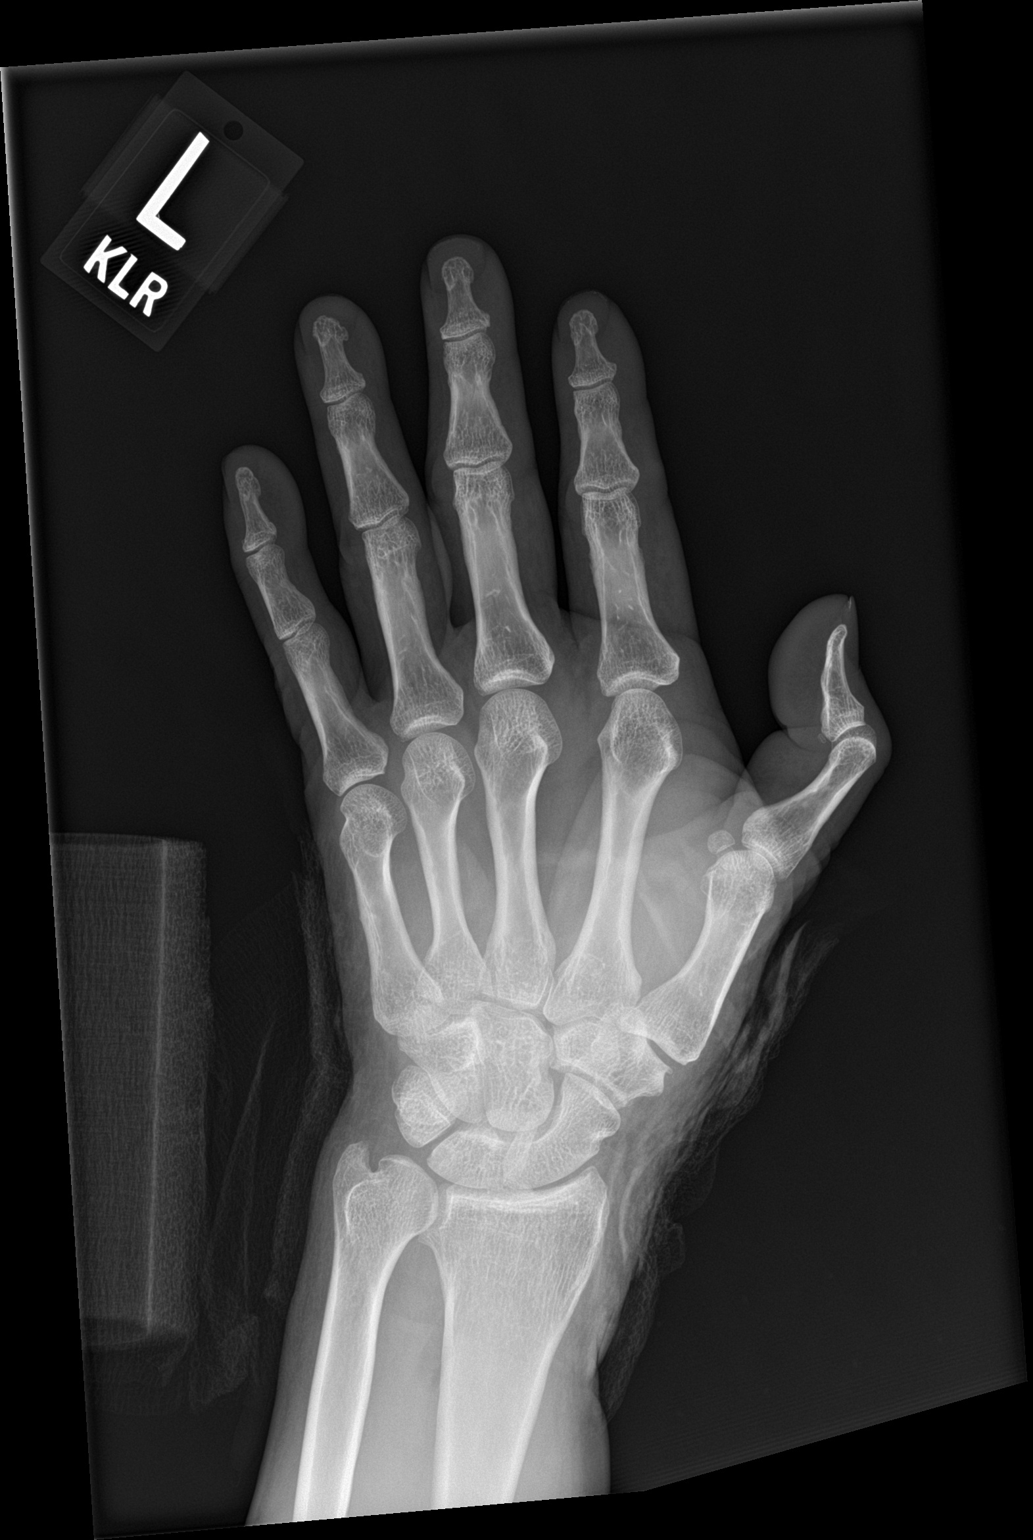

[hand obl]
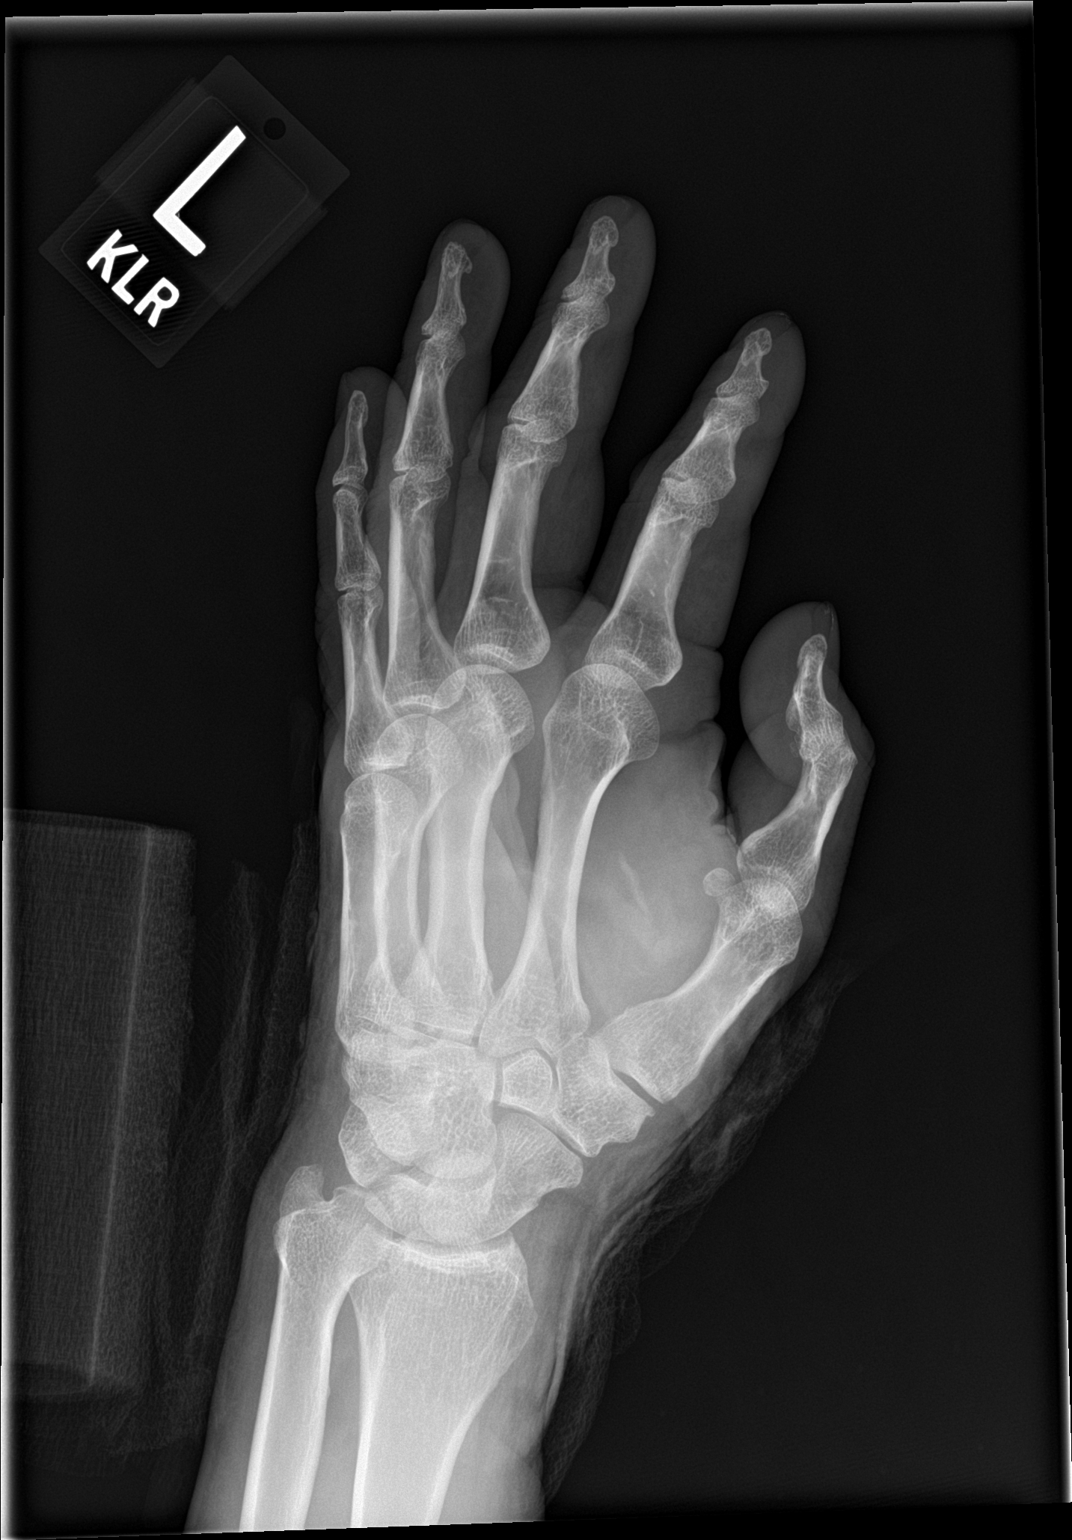

[hand lat]
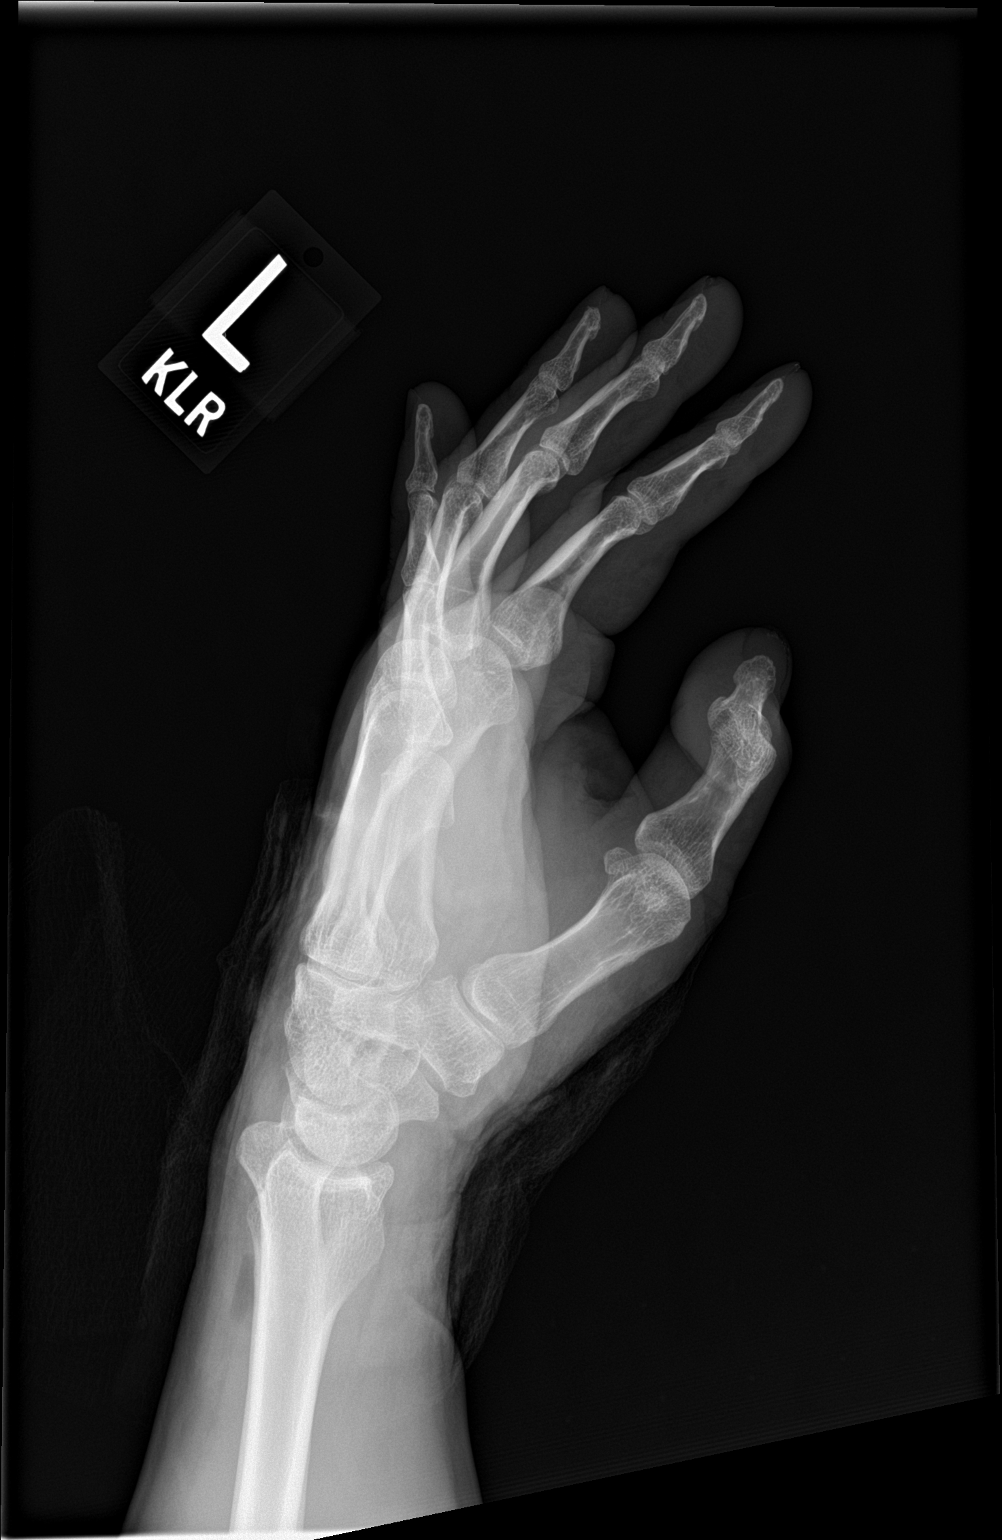

[3 of 3 positions shown; findings below may reference images not displayed]

FINDINGS: There is no evidence of fracture or dislocation. There is no
evidence of arthropathy or other focal bone abnormality. No evidence
of radiopaque foreign body.
IMPRESSION: No evidence of fracture or radiopaque foreign body.

## 2022-02-23 ENCOUNTER — Ambulatory Visit (INDEPENDENT_AMBULATORY_CARE_PROVIDER_SITE_OTHER): Payer: BLUE CROSS/BLUE SHIELD | Admitting: Urology

## 2022-02-23 ENCOUNTER — Encounter: Payer: Self-pay | Admitting: Urology

## 2022-02-23 VITALS — BP 121/72 | HR 94 | Ht 67.0 in | Wt 172.0 lb

## 2022-02-23 DIAGNOSIS — N5319 Other ejaculatory dysfunction: Secondary | ICD-10-CM | POA: Diagnosis not present

## 2022-02-23 DIAGNOSIS — R3912 Poor urinary stream: Secondary | ICD-10-CM

## 2022-02-23 DIAGNOSIS — R3911 Hesitancy of micturition: Secondary | ICD-10-CM | POA: Diagnosis not present

## 2022-02-23 DIAGNOSIS — N401 Enlarged prostate with lower urinary tract symptoms: Secondary | ICD-10-CM | POA: Diagnosis not present

## 2022-02-23 DIAGNOSIS — N400 Enlarged prostate without lower urinary tract symptoms: Secondary | ICD-10-CM

## 2022-02-23 DIAGNOSIS — R102 Pelvic and perineal pain: Secondary | ICD-10-CM

## 2022-02-23 DIAGNOSIS — R350 Frequency of micturition: Secondary | ICD-10-CM

## 2022-02-23 DIAGNOSIS — Z125 Encounter for screening for malignant neoplasm of prostate: Secondary | ICD-10-CM | POA: Diagnosis not present

## 2022-02-23 LAB — BLADDER SCAN AMB NON-IMAGING: SCA Result: 30

## 2022-02-23 NOTE — Progress Notes (Signed)
02/23/2022 1:42 PM   Robert Avery 04-Sep-1960 [id number]  Referring provider: Sherron Monday, MD [address] [place],  [place] [address]  Chief Complaint  Patient presents with   Benign Prostatic Hypertrophy    HPI: Robert Avery is a 61 y.o. male referred for evaluation of lower urinary tract symptoms.  A [place]arin [place] interpreter was present during this visit.  Previous complaints of urinary frequency, hesitancy and weak urinary stream Started on doxazosin approximately 1 year ago without improvement in symptoms.  Tamsulosin and dutasteride were added approximately 6 months ago with significant improvement in his lower urinary tract symptoms Presently has no voiding complaints and IPSS today 3/35 He does complain of suprapubic pain which occurs several times per day without precipitating, aggravating or alleviating factors.  Severity mild-moderate He also complains of significant decrease in semen volume which is bothersome. Denies dysuria or gross hematuria No flank or abdominal pain   PMH: Past Medical History:  Diagnosis Date   Atopic dermatitis    BPH (benign prostatic hyperplasia)    Diabetes mellitus without complication (HCC)    Pt takes Metformin   Fatty liver    GERD (gastroesophageal reflux disease)    Hyperlipidemia 03/22/2016   Hypertension    Lumbosacral radiculopathy at L4     Surgical History: Past Surgical History:  Procedure Laterality Date   ESOPHAGOGASTRODUODENOSCOPY (EGD) WITH PROPOFOL N/A 03/14/2017   Procedure: ESOPHAGOGASTRODUODENOSCOPY (EGD) WITH PROPOFOL;  Surgeon: Scot Jun, MD;  Location: [facility] ENDOSCOPY;  Service: Endoscopy;  Laterality: N/A;    Home Medications:  Allergies as of 02/23/2022   No Known Allergies      Medication List        Accurate as of February 23, 2022  1:42 PM. If you have any questions, ask your nurse or doctor.          STOP taking these medications    oxyCODONE-acetaminophen 5-325 MG  tablet Commonly known as: Percocet Stopped by: Riki Altes, MD       TAKE these medications    metFORMIN 500 MG tablet Commonly known as: GLUCOPHAGE Take by mouth 2 (two) times daily with a meal.   pantoprazole 40 MG tablet Commonly known as: PROTONIX Take 40 mg by mouth 2 (two) times daily.   rosuvastatin 5 MG tablet Commonly known as: CRESTOR Take 5 mg by mouth daily.   valsartan-hydrochlorothiazide 320-25 MG tablet Commonly known as: DIOVAN-HCT Take 1 tablet by mouth daily.        Allergies: No Known Allergies  Family History: No family history on file.  Social History:  reports that he has never smoked. He has never used smokeless tobacco. He reports that he does not drink alcohol and does not use drugs.   Physical Exam: BP 121/72   Pulse 94   Ht 5\' 7"  (1.702 m)   Wt 172 lb (78 kg)   BMI 26.94 kg/m   Constitutional:  Alert and oriented, No acute distress. HEENT: [place] AT, moist mucus membranes.  Trachea midline, no masses. Cardiovascular: No clubbing, cyanosis, or edema. Respiratory: Normal respiratory effort, no increased work of breathing. GI: Abdomen is soft, nontender, nondistended, no abdominal masses GU: Prostate 40 g, smooth without nodules Skin: No rashes, bruises or suspicious lesions. Neurologic: Grossly intact, no focal deficits, moving all 4 extremities. Psychiatric: Normal mood and affect.  Laboratory Data:  Urinalysis Dipstick/microscopy negative  Assessment & Plan:    1. Benign prostatic hyperplasia, unspecified whether lower urinary tract symptoms present Marked improvement  in lower urinary tract symptoms on tamsulosin/finasteride PVR today 30 mL He complains of bothersome loss of semen volume and was informed this would be secondary to his prostate medications Due to the side effect he would like to discontinue his present prostate medications and requested an alternative. Rx alfuzosin sent to pharmacy  2. Ejaculatory  dysfunction As above  3.  Pelvic pain Schedule pelvic CT  4.  Prostate cancer screening Benign DRE No recent PSA performed on forwarded office notes.  He did elect to have a PSA drawn and will be notified with the results   Riki Altes, MD  [facility] [address], [address] [place], [place] [address] [phone]

## 2022-02-24 ENCOUNTER — Telehealth: Payer: Self-pay | Admitting: *Deleted

## 2022-02-24 LAB — URINALYSIS, COMPLETE
Bilirubin, UA: NEGATIVE
Ketones, UA: NEGATIVE
Leukocytes,UA: NEGATIVE
Nitrite, UA: NEGATIVE
Protein,UA: NEGATIVE
RBC, UA: NEGATIVE
Specific Gravity, UA: 1.02 (ref 1.005–1.030)
Urobilinogen, Ur: 0.2 mg/dL (ref 0.2–1.0)
pH, UA: 5.5 (ref 5.0–7.5)

## 2022-02-24 LAB — MICROSCOPIC EXAMINATION
Bacteria, UA: NONE SEEN
Epithelial Cells (non renal): NONE SEEN /hpf (ref 0–10)
RBC, Urine: NONE SEEN /hpf (ref 0–2)

## 2022-02-24 LAB — PSA: Prostate Specific Ag, Serum: 3.8 ng/mL (ref 0.0–4.0)

## 2022-02-24 NOTE — Telephone Encounter (Signed)
-----   Message from Riki Altes, MD sent at 02/24/2022  7:14 AM EDT ----- PSA normal 3.8 (Mandarin [place] interpreter).

## 2022-02-24 NOTE — Telephone Encounter (Signed)
Used  interpreter line left voice mail .

## 2022-02-25 ENCOUNTER — Encounter: Payer: Self-pay | Admitting: Urology

## 2022-02-25 MED ORDER — ALFUZOSIN HCL ER 10 MG PO TB24: 10.0000 mg | ORAL_TABLET | Freq: Every day | ORAL | 2 refills | Status: DC

## 2022-05-23 ENCOUNTER — Other Ambulatory Visit: Payer: Self-pay | Admitting: Urology

## 2022-08-14 DIAGNOSIS — I10 Essential (primary) hypertension: Secondary | ICD-10-CM | POA: Diagnosis not present

## 2022-08-14 DIAGNOSIS — E118 Type 2 diabetes mellitus with unspecified complications: Secondary | ICD-10-CM | POA: Diagnosis not present

## 2022-08-14 DIAGNOSIS — E669 Obesity, unspecified: Secondary | ICD-10-CM | POA: Diagnosis not present

## 2022-08-14 DIAGNOSIS — E785 Hyperlipidemia, unspecified: Secondary | ICD-10-CM | POA: Diagnosis not present

## 2022-08-14 DIAGNOSIS — N401 Enlarged prostate with lower urinary tract symptoms: Secondary | ICD-10-CM | POA: Diagnosis not present

## 2022-08-15 DIAGNOSIS — K4090 Unilateral inguinal hernia, without obstruction or gangrene, not specified as recurrent: Secondary | ICD-10-CM | POA: Diagnosis not present

## 2022-08-15 DIAGNOSIS — K760 Fatty (change of) liver, not elsewhere classified: Secondary | ICD-10-CM | POA: Diagnosis not present

## 2022-08-15 DIAGNOSIS — G2581 Restless legs syndrome: Secondary | ICD-10-CM | POA: Diagnosis not present

## 2022-08-15 DIAGNOSIS — R252 Cramp and spasm: Secondary | ICD-10-CM | POA: Diagnosis not present

## 2022-08-15 DIAGNOSIS — E118 Type 2 diabetes mellitus with unspecified complications: Secondary | ICD-10-CM | POA: Diagnosis not present

## 2022-08-15 DIAGNOSIS — E785 Hyperlipidemia, unspecified: Secondary | ICD-10-CM | POA: Diagnosis not present

## 2022-08-15 DIAGNOSIS — M7061 Trochanteric bursitis, right hip: Secondary | ICD-10-CM | POA: Diagnosis not present

## 2022-08-15 DIAGNOSIS — I10 Essential (primary) hypertension: Secondary | ICD-10-CM | POA: Diagnosis not present

## 2022-08-15 DIAGNOSIS — J309 Allergic rhinitis, unspecified: Secondary | ICD-10-CM | POA: Diagnosis not present

## 2022-08-15 DIAGNOSIS — N401 Enlarged prostate with lower urinary tract symptoms: Secondary | ICD-10-CM | POA: Diagnosis not present

## 2022-08-15 DIAGNOSIS — M7710 Lateral epicondylitis, unspecified elbow: Secondary | ICD-10-CM | POA: Diagnosis not present

## 2022-08-31 ENCOUNTER — Other Ambulatory Visit: Payer: Self-pay | Admitting: Internal Medicine

## 2022-09-26 ENCOUNTER — Encounter: Payer: 59 | Admitting: Internal Medicine

## 2022-09-27 ENCOUNTER — Encounter: Payer: Self-pay | Admitting: Internal Medicine

## 2022-09-27 ENCOUNTER — Ambulatory Visit: Payer: 59 | Admitting: Internal Medicine

## 2022-09-27 VITALS — BP 110/80 | HR 68 | Ht 67.0 in | Wt 173.0 lb

## 2022-09-27 DIAGNOSIS — N401 Enlarged prostate with lower urinary tract symptoms: Secondary | ICD-10-CM | POA: Diagnosis not present

## 2022-09-27 DIAGNOSIS — Z1331 Encounter for screening for depression: Secondary | ICD-10-CM | POA: Diagnosis not present

## 2022-09-27 DIAGNOSIS — N138 Other obstructive and reflux uropathy: Secondary | ICD-10-CM | POA: Diagnosis not present

## 2022-09-27 DIAGNOSIS — L308 Other specified dermatitis: Secondary | ICD-10-CM | POA: Diagnosis not present

## 2022-09-27 DIAGNOSIS — I10 Essential (primary) hypertension: Secondary | ICD-10-CM

## 2022-09-27 DIAGNOSIS — E119 Type 2 diabetes mellitus without complications: Secondary | ICD-10-CM

## 2022-09-27 LAB — GLUCOSE, POCT (MANUAL RESULT ENTRY): POC Glucose: 169 mg/dl — AB (ref 70–99)

## 2022-09-27 MED ORDER — METFORMIN HCL 1000 MG PO TABS: 1000.0000 mg | ORAL_TABLET | Freq: Two times a day (BID) | ORAL | 1 refills | Status: DC

## 2022-09-27 NOTE — Progress Notes (Signed)
Established Patient Office Visit  Subjective:  Patient ID: Robert Avery, male    DOB: [birth year]  Age: 62 y.o. MRN: [id number]  Chief Complaint  Patient presents with   Annual Exam    Cpe with 6 week lab results    No new complaints, here for CPE lab review and medication refills. Last labs done about 6 wks ago notable for uncontrolled diabetes and elevated lipids.      Past Medical History:  Diagnosis Date   Atopic dermatitis    BPH (benign prostatic hyperplasia)    Diabetes mellitus without complication (HCC)    Pt takes Metformin   Fatty liver    GERD (gastroesophageal reflux disease)    Hyperlipidemia 03/22/2016   Hypertension    Lumbosacral radiculopathy at L4     Social History   Socioeconomic History   Marital status: Married    Spouse name: Not on file   Number of children: Not on file   Years of education: Not on file   Highest education level: Not on file  Occupational History   Not on file  Tobacco Use   Smoking status: Never   Smokeless tobacco: Never  Substance and Sexual Activity   Alcohol use: No   Drug use: No   Sexual activity: Not on file  Other Topics Concern   Not on file  Social History Narrative   Not on file   Social Determinants of Health   Financial Resource Strain: Not on file  Food Insecurity: Not on file  Transportation Needs: Not on file  Physical Activity: Not on file  Stress: Not on file  Social Connections: Not on file  Intimate Partner Violence: Not on file    No family history on file.  No Known Allergies  Review of Systems  Constitutional: Negative.   HENT: Negative.    Eyes: Negative.   Respiratory: Negative.    Cardiovascular: Negative.   Gastrointestinal: Negative.        Right lower abdominal discomfort  Genitourinary: Negative.   Skin: Negative.   Neurological: Negative.   Endo/Heme/Allergies: Negative.        Objective:   BP 110/80   Pulse 68   Ht '5\' 7"'$  (1.702 m)   Wt 173 lb (78.5 kg)    SpO2 100%   BMI 27.10 kg/m   Vitals:   09/27/22 0926  BP: 110/80  Pulse: 68  Height: '5\' 7"'$  (1.702 m)  Weight: 173 lb (78.5 kg)  SpO2: 100%  BMI (Calculated): 27.09    Physical Exam Vitals reviewed.  Constitutional:      Appearance: Normal appearance.  HENT:     Head: Normocephalic.     Left Ear: There is no impacted cerumen.     Nose: Nose normal.     Mouth/Throat:     Mouth: Mucous membranes are moist.     Pharynx: No posterior oropharyngeal erythema.  Eyes:     Extraocular Movements: Extraocular movements intact.     Pupils: Pupils are equal, round, and reactive to light.  Cardiovascular:     Rate and Rhythm: Regular rhythm.     Chest Wall: PMI is not displaced.     Pulses: Normal pulses.     Heart sounds: Normal heart sounds. No murmur heard. Pulmonary:     Effort: Pulmonary effort is normal.     Breath sounds: Normal air entry. No rhonchi or rales.  Abdominal:     General: Abdomen is flat. Bowel sounds are normal. There is  no distension.     Palpations: Abdomen is soft. There is no hepatomegaly, splenomegaly or mass.     Tenderness: There is no abdominal tenderness.  Musculoskeletal:        General: Normal range of motion.     Cervical back: Normal range of motion and neck supple.     Right lower leg: No edema.     Left lower leg: No edema.  Skin:    General: Skin is warm and dry.     Findings: Rash present. Rash is papular.     Comments: Anterior abd wall with scratch marks  Neurological:     General: No focal deficit present.     Mental Status: He is alert and oriented to person, place, and time.     Cranial Nerves: No cranial nerve deficit.     Motor: No weakness.  Psychiatric:        Mood and Affect: Mood normal.        Behavior: Behavior normal.      Results for orders placed or performed in visit on 09/27/22  POCT Glucose (CBG)  Result Value Ref Range   POC Glucose 169 (A) 70 - 99 mg/dl    Recent Results (from the past 2160 hour(Keen Ewalt))   POCT Glucose (CBG)     Status: Abnormal   Collection Time: 09/27/22  9:34 AM  Result Value Ref Range   POC Glucose 169 (A) 70 - 99 mg/dl      Assessment & Plan:   Problem List Items Addressed This Visit   None Visit Diagnoses     Type 2 diabetes mellitus without complication, without long-term current use of insulin (HCC)    -  Primary   Relevant Medications   pioglitazone (ACTOS) 30 MG tablet   Other Relevant Orders   POCT Glucose (CBG) (Completed)       No follow-ups on file.   Total time spent: 35 minutes  Volanda Napoleon, MD  09/27/2022

## 2022-10-17 DIAGNOSIS — Z789 Other specified health status: Secondary | ICD-10-CM | POA: Diagnosis not present

## 2022-10-17 DIAGNOSIS — K449 Diaphragmatic hernia without obstruction or gangrene: Secondary | ICD-10-CM | POA: Diagnosis not present

## 2022-10-17 DIAGNOSIS — Z800 Family history of malignant neoplasm of digestive organs: Secondary | ICD-10-CM | POA: Diagnosis not present

## 2022-10-17 DIAGNOSIS — Z1211 Encounter for screening for malignant neoplasm of colon: Secondary | ICD-10-CM | POA: Diagnosis not present

## 2022-10-17 DIAGNOSIS — K30 Functional dyspepsia: Secondary | ICD-10-CM | POA: Diagnosis not present

## 2022-10-17 DIAGNOSIS — K591 Functional diarrhea: Secondary | ICD-10-CM | POA: Diagnosis not present

## 2022-10-17 DIAGNOSIS — K219 Gastro-esophageal reflux disease without esophagitis: Secondary | ICD-10-CM | POA: Diagnosis not present

## 2022-11-01 ENCOUNTER — Other Ambulatory Visit: Payer: Self-pay

## 2022-11-01 DIAGNOSIS — E119 Type 2 diabetes mellitus without complications: Secondary | ICD-10-CM

## 2022-11-02 MED ORDER — METFORMIN HCL 1000 MG PO TABS: 1000.0000 mg | ORAL_TABLET | Freq: Two times a day (BID) | ORAL | 1 refills | Status: DC

## 2022-11-07 ENCOUNTER — Ambulatory Visit: Payer: 59 | Admitting: Internal Medicine

## 2022-11-17 ENCOUNTER — Other Ambulatory Visit: Payer: Self-pay | Admitting: Internal Medicine

## 2022-11-17 DIAGNOSIS — E118 Type 2 diabetes mellitus with unspecified complications: Secondary | ICD-10-CM

## 2022-11-28 ENCOUNTER — Ambulatory Visit: Payer: 59 | Admitting: Internal Medicine

## 2022-12-04 ENCOUNTER — Other Ambulatory Visit: Payer: Self-pay | Admitting: Nurse Practitioner

## 2022-12-04 DIAGNOSIS — E119 Type 2 diabetes mellitus without complications: Secondary | ICD-10-CM

## 2022-12-11 ENCOUNTER — Other Ambulatory Visit: Payer: Self-pay | Admitting: Internal Medicine

## 2022-12-11 DIAGNOSIS — E119 Type 2 diabetes mellitus without complications: Secondary | ICD-10-CM

## 2022-12-13 ENCOUNTER — Other Ambulatory Visit: Payer: Self-pay | Admitting: Internal Medicine

## 2022-12-13 DIAGNOSIS — I10 Essential (primary) hypertension: Secondary | ICD-10-CM

## 2022-12-18 ENCOUNTER — Other Ambulatory Visit: Payer: Self-pay | Admitting: Internal Medicine

## 2022-12-18 DIAGNOSIS — R197 Diarrhea, unspecified: Secondary | ICD-10-CM

## 2022-12-19 DIAGNOSIS — I10 Essential (primary) hypertension: Secondary | ICD-10-CM | POA: Diagnosis not present

## 2022-12-19 DIAGNOSIS — E119 Type 2 diabetes mellitus without complications: Secondary | ICD-10-CM | POA: Diagnosis not present

## 2022-12-19 DIAGNOSIS — Z809 Family history of malignant neoplasm, unspecified: Secondary | ICD-10-CM | POA: Diagnosis not present

## 2022-12-19 DIAGNOSIS — K219 Gastro-esophageal reflux disease without esophagitis: Secondary | ICD-10-CM | POA: Diagnosis not present

## 2022-12-19 DIAGNOSIS — E785 Hyperlipidemia, unspecified: Secondary | ICD-10-CM | POA: Diagnosis not present

## 2022-12-19 DIAGNOSIS — Z7984 Long term (current) use of oral hypoglycemic drugs: Secondary | ICD-10-CM | POA: Diagnosis not present

## 2022-12-26 ENCOUNTER — Other Ambulatory Visit: Payer: 59

## 2022-12-26 ENCOUNTER — Other Ambulatory Visit: Payer: Self-pay

## 2022-12-26 DIAGNOSIS — E119 Type 2 diabetes mellitus without complications: Secondary | ICD-10-CM | POA: Diagnosis not present

## 2022-12-27 ENCOUNTER — Ambulatory Visit: Payer: 59 | Admitting: Internal Medicine

## 2022-12-27 ENCOUNTER — Encounter: Payer: Self-pay | Admitting: Internal Medicine

## 2022-12-27 ENCOUNTER — Other Ambulatory Visit: Payer: Self-pay | Admitting: Internal Medicine

## 2022-12-27 VITALS — BP 124/86 | HR 65 | Ht 67.0 in | Wt 173.0 lb

## 2022-12-27 DIAGNOSIS — E119 Type 2 diabetes mellitus without complications: Secondary | ICD-10-CM

## 2022-12-27 DIAGNOSIS — B351 Tinea unguium: Secondary | ICD-10-CM

## 2022-12-27 LAB — POC CREATINE & ALBUMIN,URINE
Creatinine, POC: 100 mg/dL
Microalbumin Ur, POC: 30 mg/L

## 2022-12-27 LAB — LIPID PANEL
Chol/HDL Ratio: 3.4 ratio (ref 0.0–5.0)
Cholesterol, Total: 141 mg/dL (ref 100–199)
HDL: 42 mg/dL (ref >39)
LDL Chol Calc (NIH): 65 mg/dL (ref 0–99)
Triglycerides: 205 mg/dL — ABNORMAL HIGH (ref 0–149)
VLDL Cholesterol Cal: 34 mg/dL (ref 5–40)

## 2022-12-27 LAB — POCT CBG (FASTING - GLUCOSE)-MANUAL ENTRY: Glucose Fasting, POC: 145 mg/dL — AB (ref 70–99)

## 2022-12-27 LAB — HEMOGLOBIN A1C
Est. average glucose Bld gHb Est-mCnc: 177 mg/dL
Hgb A1c MFr Bld: 7.8 % — ABNORMAL HIGH (ref 4.8–5.6)

## 2022-12-27 MED ORDER — PIOGLITAZONE HCL 45 MG PO TABS
45.0000 mg | ORAL_TABLET | Freq: Every day | ORAL | 11 refills | Status: DC
Start: 2022-12-27 — End: 2023-11-26

## 2022-12-27 MED ORDER — TERBINAFINE HCL 250 MG PO TABS
250.0000 mg | ORAL_TABLET | Freq: Every day | ORAL | 0 refills | Status: AC
Start: 2022-12-27 — End: 2023-03-27

## 2022-12-27 MED ORDER — GLYBURIDE 2.5 MG PO TABS
2.5000 mg | ORAL_TABLET | Freq: Every day | ORAL | 0 refills | Status: DC
Start: 2022-12-27 — End: 2022-12-29

## 2022-12-27 NOTE — Progress Notes (Signed)
Established Patient Office Visit  Subjective:  Patient ID: Robert Avery, male    DOB: July 24, 1961  Age: 62 y.o. MRN: [id number]  Chief Complaint  Patient presents with   Follow-up    6 week follow up, discuss lab results.    No new complaints, here for lab review and medication refills. Labs reviewed and notable for uncontrolled diabetes, A1c rose to 7.8 not at target, LDL at target but trigls elevated . Denies any hypoglycemic episodes.    No other concerns at this time.   Past Medical History:  Diagnosis Date   Atopic dermatitis    BPH (benign prostatic hyperplasia)    Diabetes mellitus without complication (HCC)    Pt takes Metformin   Fatty liver    GERD (gastroesophageal reflux disease)    Hyperlipidemia 03/22/2016   Hypertension    Lumbosacral radiculopathy at L4     Past Surgical History:  Procedure Laterality Date   ESOPHAGOGASTRODUODENOSCOPY (EGD) WITH PROPOFOL N/A 03/14/2017   Procedure: ESOPHAGOGASTRODUODENOSCOPY (EGD) WITH PROPOFOL;  Surgeon: Scot Jun, MD;  Location: [facility] ENDOSCOPY;  Service: Endoscopy;  Laterality: N/A;    Social History   Socioeconomic History   Marital status: Married    Spouse name: Not on file   Number of children: Not on file   Years of education: Not on file   Highest education level: Not on file  Occupational History   Not on file  Tobacco Use   Smoking status: Never   Smokeless tobacco: Never  Substance and Sexual Activity   Alcohol use: No   Drug use: No   Sexual activity: Not on file  Other Topics Concern   Not on file  Social History Narrative   Not on file   Social Determinants of Health   Financial Resource Strain: Not on file  Food Insecurity: Not on file  Transportation Needs: Not on file  Physical Activity: Not on file  Stress: Not on file  Social Connections: Not on file  Intimate Partner Violence: Not on file    No family history on file.  No Known Allergies  Review of Systems  All  other systems reviewed and are negative.      Objective:   BP 124/86   Pulse 65   Ht 5\' 7"  (1.702 m)   Wt 173 lb (78.5 kg)   SpO2 97%   BMI 27.10 kg/m   Vitals:   12/27/22 1027  BP: 124/86  Pulse: 65  Height: 5\' 7"  (1.702 m)  Weight: 173 lb (78.5 kg)  SpO2: 97%  BMI (Calculated): 27.09    Physical Exam Vitals reviewed.  Constitutional:      Appearance: Normal appearance.  HENT:     Head: Normocephalic.     Left Ear: There is no impacted cerumen.     Nose: Nose normal.     Mouth/Throat:     Mouth: Mucous membranes are moist.     Pharynx: No posterior oropharyngeal erythema.  Eyes:     Extraocular Movements: Extraocular movements intact.     Pupils: Pupils are equal, round, and reactive to light.  Cardiovascular:     Rate and Rhythm: Regular rhythm.     Chest Wall: PMI is not displaced.     Pulses: Normal pulses.     Heart sounds: Normal heart sounds. No murmur heard. Pulmonary:     Effort: Pulmonary effort is normal.     Breath sounds: Normal air entry. No rhonchi or rales.  Abdominal:  General: Abdomen is flat. Bowel sounds are normal. There is no distension.     Palpations: Abdomen is soft. There is no hepatomegaly, splenomegaly or mass.     Tenderness: There is no abdominal tenderness.  Musculoskeletal:        General: Normal range of motion.     Cervical back: Normal range of motion and neck supple.     Right lower leg: No edema.     Left lower leg: No edema.  Skin:    General: Skin is warm and dry.     Comments: Onychomycosis of both big toenails  Neurological:     General: No focal deficit present.     Mental Status: He is alert and oriented to person, place, and time.     Cranial Nerves: No cranial nerve deficit.     Motor: No weakness.  Psychiatric:        Mood and Affect: Mood normal.        Behavior: Behavior normal.      Results for orders placed or performed in visit on 12/27/22  POCT CBG (Fasting - Glucose)  Result Value Ref  Range   Glucose Fasting, POC 145 (A) 70 - 99 mg/dL    Recent Results (from the past 2160 hour(Jermia Rigsby))  Hemoglobin A1c     Status: Abnormal   Collection Time: 12/26/22 10:09 AM  Result Value Ref Range   Hgb A1c MFr Bld 7.8 (H) 4.8 - 5.6 %    Comment:          Prediabetes: 5.7 - 6.4          Diabetes: >6.4          Glycemic control for adults with diabetes: <7.0    Est. average glucose Bld gHb Est-mCnc 177 mg/dL  Lipid panel     Status: Abnormal   Collection Time: 12/26/22 10:09 AM  Result Value Ref Range   Cholesterol, Total 141 100 - 199 mg/dL   Triglycerides 161 (H) 0 - 149 mg/dL   HDL 42 >09 mg/dL   VLDL Cholesterol Cal 34 5 - 40 mg/dL   LDL Chol Calc (NIH) 65 0 - 99 mg/dL   Chol/HDL Ratio 3.4 0.0 - 5.0 ratio    Comment:                                   T. Chol/HDL Ratio                                             Men  Women                               1/2 Avg.Risk  3.4    3.3                                   Avg.Risk  5.0    4.4                                2X Avg.Risk  9.6    7.1  3X Avg.Risk 23.4   11.0   POCT CBG (Fasting - Glucose)     Status: Abnormal   Collection Time: 12/27/22 10:32 AM  Result Value Ref Range   Glucose Fasting, POC 145 (A) 70 - 99 mg/dL      Assessment & Plan:   As per problem list. Increase Pioglitazone and add Glyburide  Problem List Items Addressed This Visit   None Visit Diagnoses     Type 2 diabetes mellitus without complication, without long-term current use of insulin (HCC)    -  Primary   Relevant Medications   pioglitazone (ACTOS) 45 MG tablet   glyBURIDE (DIABETA) 2.5 MG tablet   Other Relevant Orders   POCT CBG (Fasting - Glucose) (Completed)   POC CREATINE & ALBUMIN,URINE   Fructosamine   Onychomycosis       Relevant Medications   terbinafine (LAMISIL) 250 MG tablet       No follow-ups on file.   Total time spent: 30 minutes  Luna Fuse, MD  12/27/2022   This  document may have been prepared by [facility] Voice Recognition software and as such may include unintentional dictation errors.

## 2023-01-09 ENCOUNTER — Ambulatory Visit: Payer: 59 | Admitting: Internal Medicine

## 2023-01-09 ENCOUNTER — Encounter: Payer: Self-pay | Admitting: Internal Medicine

## 2023-01-09 VITALS — BP 114/76 | HR 85 | Ht 66.0 in | Wt 170.8 lb

## 2023-01-09 DIAGNOSIS — E119 Type 2 diabetes mellitus without complications: Secondary | ICD-10-CM

## 2023-01-09 DIAGNOSIS — L235 Allergic contact dermatitis due to other chemical products: Secondary | ICD-10-CM | POA: Diagnosis not present

## 2023-01-09 DIAGNOSIS — L509 Urticaria, unspecified: Secondary | ICD-10-CM | POA: Diagnosis not present

## 2023-01-09 LAB — POCT CBG (FASTING - GLUCOSE)-MANUAL ENTRY: Glucose Fasting, POC: 107 mg/dL — AB (ref 70–99)

## 2023-01-09 MED ORDER — METHYLPREDNISOLONE ACETATE 40 MG/ML IJ SUSP
40.0000 mg | Freq: Once | INTRAMUSCULAR | Status: AC
Start: 2023-01-09 — End: 2023-01-09
  Administered 2023-01-09: 80 mg via INTRAMUSCULAR

## 2023-01-09 MED ORDER — HYDROXYZINE HCL 10 MG PO TABS
10.0000 mg | ORAL_TABLET | Freq: Four times a day (QID) | ORAL | 1 refills | Status: AC | PRN
Start: 2023-01-09 — End: 2023-01-24

## 2023-01-09 MED ORDER — METHYLPREDNISOLONE ACETATE 80 MG/ML IJ SUSP
80.0000 mg | Freq: Once | INTRAMUSCULAR | Status: DC
Start: 2023-01-09 — End: 2023-03-07

## 2023-01-09 MED ORDER — PREDNISONE 20 MG PO TABS
40.0000 mg | ORAL_TABLET | Freq: Every day | ORAL | 0 refills | Status: DC
Start: 2023-01-09 — End: 2023-01-11

## 2023-01-09 NOTE — Addendum Note (Signed)
Addended by: Hassell Halim on: 01/09/2023 02:29 PM   Modules accepted: Orders

## 2023-01-09 NOTE — Progress Notes (Signed)
Established Patient Office Visit  Subjective:  Patient ID: Robert Avery, male    DOB: 1960-08-05  Age: 62 y.o. MRN: [id number]  Chief Complaint  Patient presents with   Acute Visit    Possible allergic reaction    C/o rash on arms, legs and face after coming into contact with home insulation on a renovation project. Rash is pruritic and raised but denies any difficulty in swallowing or breathing    No other concerns at this time.   Past Medical History:  Diagnosis Date   Atopic dermatitis    BPH (benign prostatic hyperplasia)    Diabetes mellitus without complication (HCC)    Pt takes Metformin   Fatty liver    GERD (gastroesophageal reflux disease)    Hyperlipidemia 03/22/2016   Hypertension    Lumbosacral radiculopathy at L4     Past Surgical History:  Procedure Laterality Date   ESOPHAGOGASTRODUODENOSCOPY (EGD) WITH PROPOFOL N/A 03/14/2017   Procedure: ESOPHAGOGASTRODUODENOSCOPY (EGD) WITH PROPOFOL;  Surgeon: Scot Jun, MD;  Location: [facility] ENDOSCOPY;  Service: Endoscopy;  Laterality: N/A;    Social History   Socioeconomic History   Marital status: Married    Spouse name: Not on file   Number of children: Not on file   Years of education: Not on file   Highest education level: Not on file  Occupational History   Not on file  Tobacco Use   Smoking status: Never   Smokeless tobacco: Never  Substance and Sexual Activity   Alcohol use: No   Drug use: No   Sexual activity: Not on file  Other Topics Concern   Not on file  Social History Narrative   Not on file   Social Determinants of Health   Financial Resource Strain: Not on file  Food Insecurity: Not on file  Transportation Needs: Not on file  Physical Activity: Not on file  Stress: Not on file  Social Connections: Not on file  Intimate Partner Violence: Not on file    No family history on file.  No Known Allergies  Review of Systems  All other systems reviewed and are  negative.      Objective:   BP 114/76   Pulse 85   Ht 5\' 6"  (1.676 m)   Wt 170 lb 12.8 oz (77.5 kg)   SpO2 95%   BMI 27.57 kg/m   Vitals:   01/09/23 1335  BP: 114/76  Pulse: 85  Height: 5\' 6"  (1.676 m)  Weight: 170 lb 12.8 oz (77.5 kg)  SpO2: 95%  BMI (Calculated): 27.58    Physical Exam Vitals reviewed.  Constitutional:      Appearance: Normal appearance.  HENT:     Head: Normocephalic.     Left Ear: There is no impacted cerumen.     Nose: Nose normal.     Mouth/Throat:     Mouth: Mucous membranes are moist.     Pharynx: No posterior oropharyngeal erythema.  Eyes:     Extraocular Movements: Extraocular movements intact.     Pupils: Pupils are equal, round, and reactive to light.  Cardiovascular:     Rate and Rhythm: Regular rhythm.     Chest Wall: PMI is not displaced.     Pulses: Normal pulses.     Heart sounds: Normal heart sounds. No murmur heard. Pulmonary:     Effort: Pulmonary effort is normal.     Breath sounds: Normal air entry. No rhonchi or rales.  Abdominal:     General:  Abdomen is flat. Bowel sounds are normal. There is no distension.     Palpations: Abdomen is soft. There is no hepatomegaly, splenomegaly or mass.     Tenderness: There is no abdominal tenderness.  Musculoskeletal:        General: Normal range of motion.     Cervical back: Normal range of motion and neck supple.     Right lower leg: No edema.     Left lower leg: No edema.  Skin:    General: Skin is warm and dry.     Findings: Rash present. Rash is urticarial. Papular rash: On both arms and legs.    Comments: Onychomycosis of both big toenails  Neurological:     General: No focal deficit present.     Mental Status: He is alert and oriented to person, place, and time.     Cranial Nerves: No cranial nerve deficit.     Motor: No weakness.  Psychiatric:        Mood and Affect: Mood normal.        Behavior: Behavior normal.      Results for orders placed or performed in  visit on 01/09/23  POCT CBG (Fasting - Glucose)  Result Value Ref Range   Glucose Fasting, POC 107 (A) 70 - 99 mg/dL        Assessment & Plan:  As per problem list. RTC if no improvement.   Problem List Items Addressed This Visit   None Visit Diagnoses     Type 2 diabetes mellitus without complication, without long-term current use of insulin (HCC)    -  Primary   Relevant Orders   POCT CBG (Fasting - Glucose) (Completed)   Allergic dermatitis due to other chemical product       Relevant Medications   methylPREDNISolone acetate (DEPO-MEDROL) injection 80 mg (Start on 01/09/2023  2:15 PM)   predniSONE (DELTASONE) 20 MG tablet   hydrOXYzine (ATARAX) 10 MG tablet   Urticaria       Relevant Medications   methylPREDNISolone acetate (DEPO-MEDROL) injection 80 mg (Start on 01/09/2023  2:15 PM)   predniSONE (DELTASONE) 20 MG tablet   hydrOXYzine (ATARAX) 10 MG tablet       Return -Keep appt.   Total time spent: 30 minutes  Luna Fuse, MD  01/09/2023   This document may have been prepared by [facility] [employer] Voice Recognition software and as such may include unintentional dictation errors.

## 2023-01-11 ENCOUNTER — Other Ambulatory Visit: Payer: Self-pay | Admitting: Family

## 2023-01-11 DIAGNOSIS — L509 Urticaria, unspecified: Secondary | ICD-10-CM

## 2023-01-11 DIAGNOSIS — L235 Allergic contact dermatitis due to other chemical products: Secondary | ICD-10-CM

## 2023-01-11 MED ORDER — PREDNISONE 20 MG PO TABS
40.0000 mg | ORAL_TABLET | Freq: Every day | ORAL | 0 refills | Status: DC
Start: 2023-01-11 — End: 2023-03-07

## 2023-01-19 ENCOUNTER — Encounter: Payer: Self-pay | Admitting: Gastroenterology

## 2023-01-22 ENCOUNTER — Ambulatory Visit
Admission: RE | Admit: 2023-01-22 | Discharge: 2023-01-22 | Disposition: A | Discharge status: Home / Self Care | Payer: 59 | Attending: Gastroenterology | Admitting: Gastroenterology

## 2023-01-22 ENCOUNTER — Encounter
Admission: RE | Disposition: A | Discharge status: Home / Self Care | Payer: Self-pay | Source: Home / Self Care | Attending: Gastroenterology

## 2023-01-22 ENCOUNTER — Ambulatory Visit: Payer: 59 | Admitting: General Practice

## 2023-01-22 ENCOUNTER — Encounter: Payer: Self-pay | Admitting: Gastroenterology

## 2023-01-22 DIAGNOSIS — K529 Noninfective gastroenteritis and colitis, unspecified: Secondary | ICD-10-CM | POA: Diagnosis not present

## 2023-01-22 DIAGNOSIS — K635 Polyp of colon: Secondary | ICD-10-CM | POA: Diagnosis not present

## 2023-01-22 DIAGNOSIS — Z7984 Long term (current) use of oral hypoglycemic drugs: Secondary | ICD-10-CM | POA: Diagnosis not present

## 2023-01-22 DIAGNOSIS — K2990 Gastroduodenitis, unspecified, without bleeding: Secondary | ICD-10-CM | POA: Diagnosis not present

## 2023-01-22 DIAGNOSIS — N400 Enlarged prostate without lower urinary tract symptoms: Secondary | ICD-10-CM | POA: Insufficient documentation

## 2023-01-22 DIAGNOSIS — K2980 Duodenitis without bleeding: Secondary | ICD-10-CM | POA: Insufficient documentation

## 2023-01-22 DIAGNOSIS — K5289 Other specified noninfective gastroenteritis and colitis: Secondary | ICD-10-CM | POA: Diagnosis not present

## 2023-01-22 DIAGNOSIS — K2970 Gastritis, unspecified, without bleeding: Secondary | ICD-10-CM | POA: Diagnosis not present

## 2023-01-22 DIAGNOSIS — E785 Hyperlipidemia, unspecified: Secondary | ICD-10-CM | POA: Diagnosis not present

## 2023-01-22 DIAGNOSIS — I10 Essential (primary) hypertension: Secondary | ICD-10-CM | POA: Diagnosis not present

## 2023-01-22 DIAGNOSIS — E119 Type 2 diabetes mellitus without complications: Secondary | ICD-10-CM | POA: Insufficient documentation

## 2023-01-22 DIAGNOSIS — K648 Other hemorrhoids: Secondary | ICD-10-CM | POA: Diagnosis not present

## 2023-01-22 DIAGNOSIS — K319 Disease of stomach and duodenum, unspecified: Secondary | ICD-10-CM | POA: Insufficient documentation

## 2023-01-22 DIAGNOSIS — R197 Diarrhea, unspecified: Secondary | ICD-10-CM | POA: Diagnosis not present

## 2023-01-22 DIAGNOSIS — D122 Benign neoplasm of ascending colon: Secondary | ICD-10-CM | POA: Diagnosis not present

## 2023-01-22 DIAGNOSIS — K5730 Diverticulosis of large intestine without perforation or abscess without bleeding: Secondary | ICD-10-CM | POA: Insufficient documentation

## 2023-01-22 DIAGNOSIS — Z1211 Encounter for screening for malignant neoplasm of colon: Secondary | ICD-10-CM | POA: Diagnosis not present

## 2023-01-22 DIAGNOSIS — K209 Esophagitis, unspecified without bleeding: Secondary | ICD-10-CM | POA: Diagnosis not present

## 2023-01-22 DIAGNOSIS — Z79899 Other long term (current) drug therapy: Secondary | ICD-10-CM | POA: Diagnosis not present

## 2023-01-22 DIAGNOSIS — K2100 Gastro-esophageal reflux disease with esophagitis, without bleeding: Secondary | ICD-10-CM | POA: Insufficient documentation

## 2023-01-22 DIAGNOSIS — K626 Ulcer of anus and rectum: Secondary | ICD-10-CM | POA: Diagnosis not present

## 2023-01-22 DIAGNOSIS — K633 Ulcer of intestine: Secondary | ICD-10-CM | POA: Diagnosis not present

## 2023-01-22 DIAGNOSIS — K2960 Other gastritis without bleeding: Secondary | ICD-10-CM | POA: Diagnosis not present

## 2023-01-22 HISTORY — PX: COLONOSCOPY WITH PROPOFOL: SHX5780

## 2023-01-22 HISTORY — PX: POLYPECTOMY: SHX5525

## 2023-01-22 HISTORY — PX: ESOPHAGOGASTRODUODENOSCOPY (EGD) WITH PROPOFOL: SHX5813

## 2023-01-22 HISTORY — PX: BIOPSY: SHX5522

## 2023-01-22 LAB — GLUCOSE, CAPILLARY: Glucose-Capillary: 103 mg/dL — ABNORMAL HIGH (ref 70–99)

## 2023-01-22 SURGERY — COLONOSCOPY WITH PROPOFOL
Anesthesia: General

## 2023-01-22 MED ORDER — LIDOCAINE HCL (CARDIAC) PF 100 MG/5ML IV SOSY
PREFILLED_SYRINGE | INTRAVENOUS | Status: DC | PRN
  Administered 2023-01-22: 100 mg via INTRAVENOUS

## 2023-01-22 MED ORDER — PROPOFOL 10 MG/ML IV BOLUS
INTRAVENOUS | Status: DC | PRN
  Administered 2023-01-22 (×3): 20 mg via INTRAVENOUS
  Administered 2023-01-22: 70 mg via INTRAVENOUS
  Administered 2023-01-22: 30 mg via INTRAVENOUS

## 2023-01-22 MED ORDER — SODIUM CHLORIDE 0.9 % IV SOLN: INTRAVENOUS | Status: DC

## 2023-01-22 MED ORDER — SODIUM CHLORIDE 0.9 % IV SOLN: INTRAVENOUS | Status: DC | PRN

## 2023-01-22 MED ORDER — GLYCOPYRROLATE 0.2 MG/ML IJ SOLN
INTRAMUSCULAR | Status: DC | PRN
  Administered 2023-01-22: .2 mg via INTRAVENOUS

## 2023-01-22 MED ORDER — PROPOFOL 500 MG/50ML IV EMUL
INTRAVENOUS | Status: DC | PRN
  Administered 2023-01-22: 155 ug/kg/min via INTRAVENOUS

## 2023-01-22 NOTE — Anesthesia Preprocedure Evaluation (Signed)
Anesthesia Evaluation  Patient identified by MRN, date of birth, ID band Patient awake    Reviewed: Allergy & Precautions, NPO status , Patient's Chart, lab work & pertinent test results  Airway Mallampati: III  TM Distance: >3 FB Neck ROM: full    Dental  (+) Chipped   Pulmonary neg pulmonary ROS   Pulmonary exam normal        Cardiovascular hypertension, negative cardio ROS Normal cardiovascular exam     Neuro/Psych negative neurological ROS  negative psych ROS   GI/Hepatic Neg liver ROS,GERD  Medicated,,  Endo/Other  negative endocrine ROSdiabetes    Renal/GU negative Renal ROS  negative genitourinary   Musculoskeletal   Abdominal   Peds  Hematology negative hematology ROS (+)   Anesthesia Other Findings Past Medical History: No date: Atopic dermatitis No date: BPH (benign prostatic hyperplasia) No date: Diabetes mellitus without complication (HCC)     Comment:  Pt takes Metformin No date: Fatty liver No date: GERD (gastroesophageal reflux disease) 03/22/2016: Hyperlipidemia No date: Hypertension No date: Lumbosacral radiculopathy at L4  Past Surgical History: 03/14/2017: ESOPHAGOGASTRODUODENOSCOPY (EGD) WITH PROPOFOL; N/A     Comment:  Procedure: ESOPHAGOGASTRODUODENOSCOPY (EGD) WITH               PROPOFOL;  Surgeon: Scot Jun, MD;  Location:               [facility] ENDOSCOPY;  Service: Endoscopy;  Laterality: N/A;  BMI    Body Mass Index: 25.91 kg/m      Reproductive/Obstetrics negative OB ROS                             Anesthesia Physical Anesthesia Plan  ASA: 2  Anesthesia Plan: General   Post-op Pain Management:    Induction: Intravenous  PONV Risk Score and Plan: Ondansetron, TIVA and Propofol infusion  Airway Management Planned: Natural Airway and Nasal Cannula  Additional Equipment:   Intra-op Plan:   Post-operative Plan:   Informed Consent: I  have reviewed the patients History and Physical, chart, labs and discussed the procedure including the risks, benefits and alternatives for the proposed anesthesia with the patient or authorized representative who has indicated his/her understanding and acceptance.     Dental Advisory Given  Plan Discussed with: Anesthesiologist, CRNA and Surgeon  Anesthesia Plan Comments: (Patient consented for risks of anesthesia including but not limited to:  - adverse reactions to medications - risk of airway placement if required - damage to eyes, teeth, lips or other oral mucosa - nerve damage due to positioning  - sore throat or hoarseness - Damage to heart, brain, nerves, lungs, other parts of body or loss of life  Patient voiced understanding.)       Anesthesia Quick Evaluation

## 2023-01-22 NOTE — Transfer of Care (Signed)
Immediate Anesthesia Transfer of Care Note  Patient: Robert Avery  Procedure(s) Performed: COLONOSCOPY WITH PROPOFOL ESOPHAGOGASTRODUODENOSCOPY (EGD) WITH PROPOFOL  Patient Location: Endoscopy Unit  Anesthesia Type:General  Level of Consciousness: drowsy and patient cooperative  Airway & Oxygen Therapy: Patient Spontanous Breathing and Patient connected to face mask oxygen  Post-op Assessment: Report given to RN and Post -op Vital signs reviewed and stable  Post vital signs: Reviewed and stable  Last Vitals:  Vitals Value Taken Time  BP    Temp    Pulse    Resp    SpO2      Last Pain:  Vitals:   01/22/23 1356  TempSrc: Temporal  PainSc: Asleep         Complications: No notable events documented.

## 2023-01-22 NOTE — Op Note (Signed)
[facility]astroenterology Patient Name: Robert Avery Procedure Date: 01/22/2023 1:06 PM MRN: [id number] Account #: [id number] Date of Birth: Jun 02, 1961 Admit Type: Outpatient Age: 62 Room: [facility] ENDO ROOM 2 Gender: Male Note Status: Supervisor Override Instrument Name: Patton Salles Endoscope [id number] Procedure:             Upper GI endoscopy Indications:           Gastro-esophageal reflux disease, Dyspepsia Providers:             Trenda Moots, DO Referring MD:          Silas Flood. Ellsworth Lennox, MD (Referring MD) Medicines:             Monitored Anesthesia Care Complications:         No immediate complications. Estimated blood loss:                         Minimal. Procedure:             Pre-Anesthesia Assessment:                        - Prior to the procedure, a History and Physical was                         performed, and patient medications and allergies were                         reviewed. The patient is competent. The risks and                         benefits of the procedure and the sedation options and                         risks were discussed with the patient. All questions                         were answered and informed consent was obtained.                         Patient identification and proposed procedure were                         verified by the physician, the nurse, the anesthetist                         and the technician in the endoscopy suite. Mental                         Status Examination: alert and oriented. Airway                         Examination: normal oropharyngeal airway and neck                         mobility. Respiratory Examination: clear to                         auscultation. CV Examination: RRR, no murmurs, no S3  or S4. Prophylactic Antibiotics: The patient does not                         require prophylactic antibiotics. Prior                         Anticoagulants: The patient has  taken no anticoagulant                         or antiplatelet agents. ASA Grade Assessment: II - A                         patient with mild systemic disease. After reviewing                         the risks and benefits, the patient was deemed in                         satisfactory condition to undergo the procedure. The                         anesthesia plan was to use monitored anesthesia care                         (MAC). Immediately prior to administration of                         medications, the patient was re-assessed for adequacy                         to receive sedatives. The heart rate, respiratory                         rate, oxygen saturations, blood pressure, adequacy of                         pulmonary ventilation, and response to care were                         monitored throughout the procedure. The physical                         status of the patient was re-assessed after the                         procedure.                        After obtaining informed consent, the endoscope was                         passed under direct vision. Throughout the procedure,                         the patient's blood pressure, pulse, and oxygen                         saturations were monitored continuously. The Endoscope  was introduced through the mouth, and advanced to the                         second part of duodenum. The upper GI endoscopy was                         accomplished without difficulty. The patient tolerated                         the procedure well. Findings:      Localized moderate inflammation characterized by aphthous ulcerations       was found in the first portion of the duodenum. Biopsies were taken with       a cold forceps for histology. Estimated blood loss was minimal.      The exam of the duodenum was otherwise normal.      Localized moderate inflammation characterized by erosions was found in       the gastric  antrum. Biopsies were taken with a cold forceps for       Helicobacter pylori testing. Estimated blood loss was minimal. biopsies       from antrum and body placed in separate jars due to history of focal       intestinal metaplasia.      The exam of the stomach was otherwise normal.      Esophagogastric landmarks were identified: the gastroesophageal junction       was found at 40 cm from the incisors.      LA Grade B (one or more mucosal breaks greater than 5 mm, not extending       between the tops of two mucosal folds) esophagitis with no bleeding was       found. Biopsies were taken with a cold forceps for histology. Estimated       blood loss was minimal.      The exam of the esophagus was otherwise normal. Impression:            - Duodenitis. Biopsied.                        - Gastritis. Biopsied.                        - Esophagogastric landmarks identified.                        - LA Grade B esophagitis with no bleeding. Biopsied.                        -                        - Sh?'?r zhi ch?ngy?n. Hu?jian.                        - W?iy?n. Hu?jian.                        - Qu?d?ng sh?guan w?i biaozh?.                        - LA B j? sh?guan y?n, w? chuxie. Hu?jian. Recommendation:        - Patient has a  contact number available for                         emergencies. The signs and symptoms of potential                         delayed complications were discussed with the patient.                         Return to normal activities tomorrow. Written                         discharge instructions were provided to the patient.                        - Discharge patient to home.                        - Resume previous diet.                        - Continue present medications.                        - No ibuprofen, naproxen, or other non-steroidal                         anti-inflammatory drugs.                        - Await pathology results.                        -  Repeat upper endoscopy for surveillance based on                         pathology results.                        - Return to GI office as previously scheduled.                        - Proceed with colonoscopy                        - The findings and recommendations were discussed with                         the patient. Procedure Code(s):     --- Professional ---                        [phone], Esophagogastroduodenoscopy, flexible,                         transoral; with biopsy, single or multiple Diagnosis Code(s):     --- Professional ---                        K29.80, Duodenitis without bleeding                        K29.70, Gastritis, unspecified, without bleeding  K20.90, Esophagitis, unspecified without bleeding                        R68.81, Early satiety CPT copyright 2022 [employer] Association. All rights reserved. The codes documented in this report are preliminary and upon coder review may  be revised to meet current compliance requirements. Attending Participation:      I personally performed the entire procedure. Elfredia Nevins, DO Jaynie Collins DO, DO 01/22/2023 1:53:59 PM This report has been signed electronically. Number of Addenda: 0 Note Initiated On: 01/22/2023 1:06 PM Estimated Blood Loss:  Estimated blood loss was minimal.      [facility]

## 2023-01-22 NOTE — Anesthesia Postprocedure Evaluation (Signed)
Anesthesia Post Note  Patient: Cornie Anselmo  Procedure(s) Performed: COLONOSCOPY WITH PROPOFOL ESOPHAGOGASTRODUODENOSCOPY (EGD) WITH PROPOFOL POLYPECTOMY BIOPSY  Patient location during evaluation: Endoscopy Anesthesia Type: General Level of consciousness: awake and alert Pain management: pain level controlled Vital Signs Assessment: post-procedure vital signs reviewed and stable Respiratory status: spontaneous breathing, nonlabored ventilation, respiratory function stable and patient connected to nasal cannula oxygen Cardiovascular status: blood pressure returned to baseline and stable Postop Assessment: no apparent nausea or vomiting Anesthetic complications: no  No notable events documented.   Last Vitals:  Vitals:   01/22/23 1416 01/22/23 1426  BP: 129/79 (!) 144/85  Pulse: 82 72  Resp: (!) 26 15  Temp:    SpO2: 100% 100%    Last Pain:  Vitals:   01/22/23 1426  TempSrc:   PainSc: 0-No pain                 Stephanie Coup

## 2023-01-22 NOTE — Op Note (Signed)
[facility] Gastroenterology Patient Name: Robert Avery Procedure Date: 01/22/2023 1:03 PM MRN: [id number] Account #: [id number] Date of Birth: 13-Jul-1961 Admit Type: Outpatient Age: 62 Room: [facility] END[facility] ROOM 2 Gender: Male Note Status: Finalized Instrument Name: Colonoscope [id number] Procedure:             Colonoscopy Indications:           Chronic diarrhea Providers:             Trenda Moots, DO Referring MD:          Silas Flood. Ellsworth Lennox, MD (Referring MD) Medicines:             Monitored Anesthesia Care Complications:         No immediate complications. Estimated blood loss:                         Minimal. Procedure:             Pre-Anesthesia Assessment:                        - Prior to the procedure, a History and Physical was                         performed, and patient medications and allergies were                         reviewed. The patient is competent. The risks and                         benefits of the procedure and the sedation options and                         risks were discussed with the patient. All questions                         were answered and informed consent was obtained.                         Patient identification and proposed procedure were                         verified by the physician, the nurse, the anesthetist                         and the technician in the endoscopy suite. Mental                         Status Examination: alert and oriented. Airway                         Examination: normal oropharyngeal airway and neck                         mobility. Respiratory Examination: clear to                         auscultation. CV Examination: RRR, no murmurs, no S3  or S4. Prophylactic Antibiotics: The patient does not                         require prophylactic antibiotics. Prior                         Anticoagulants: The patient has taken no anticoagulant                          or antiplatelet agents. ASA Grade Assessment: II - A                         patient with mild systemic disease. After reviewing                         the risks and benefits, the patient was deemed in                         satisfactory condition to undergo the procedure. The                         anesthesia plan was to use monitored anesthesia care                         (MAC). Immediately prior to administration of                         medications, the patient was re-assessed for adequacy                         to receive sedatives. The heart rate, respiratory                         rate, oxygen saturations, blood pressure, adequacy of                         pulmonary ventilation, and response to care were                         monitored throughout the procedure. The physical                         status of the patient was re-assessed after the                         procedure.                        After obtaining informed consent, the colonoscope was                         passed under direct vision. Throughout the procedure,                         the patient's blood pressure, pulse, and oxygen                         saturations were monitored continuously. The  Colonoscope was introduced through the anus and                         advanced to the the terminal ileum, with                         identification of the appendiceal orifice and IC                         valve. The colonoscopy was performed without                         difficulty. The patient tolerated the procedure well.                         The quality of the bowel preparation was evaluated                         using the BBPS [facility] Bowel Preparation Scale) with                         scores of: Right Colon = 3, Transverse Colon = 3 and                         Left Colon = 3 (entire mucosa seen well with no                         residual staining, small fragments  of stool or opaque                         liquid). The total BBPS score equals 9. The terminal                         ileum, ileocecal valve, appendiceal orifice, and                         rectum were photographed. Findings:      The perianal and digital rectal examinations were normal. Pertinent       negatives include normal sphincter tone.      The terminal ileum appeared normal. Estimated blood loss: none.      Discontinuous areas of nonbleeding ulcerated mucosa with no stigmata of       recent bleeding were present in the entire colon. Biopsies were taken       with a cold forceps for histology. These apthous appearing ulcerations       were scattered throughout the colon. Most prevalent in the left colon.       Estimated blood loss was minimal.      A 1 mm polyp was found in the ascending colon. The polyp was sessile.       The polyp was removed with a jumbo cold forceps. Resection and retrieval       were complete. Estimated blood loss was minimal.      Normal mucosa was found in the entire colon. Biopsies for histology were       taken with a cold forceps from the right colon and left colon for       evaluation of microscopic colitis. Estimated blood  loss was minimal.      Non-bleeding internal hemorrhoids were found during retroflexion. The       hemorrhoids were small. Estimated blood loss: none.      The exam was otherwise without abnormality on direct and retroflexion       views. Impression:            - The examined portion of the ileum was normal.                        - Mucosal ulceration in the entire examined colon.                         Biopsied.                        - One 1 mm polyp in the ascending colon, removed with                         a jumbo cold forceps. Resected and retrieved.                        - Normal mucosa in the entire examined colon. Biopsied.                        - Non-bleeding internal hemorrhoids.                        - The  examination was otherwise normal on direct and                         retroflexion views.                        -                        - Huchng de jianch bfn zhngchng.                        - Zhengg jianch jichng zhanm [place]. Hujian.                        Cephus [place] jichng ni you yig 1 homi de xru, yng                         jxng leng nizi quchu. Qiech bng quchu.                        - Zhengg jianch jichng de zhngchng zhanm.                         Hujian.                        - B chuxie de nizh.                        - Zhjie guanch h hu qu guanch de [place] zi qta  fangmin jun zhngchng. Recommendation:        - Patient has a contact number available for                         emergencies. The signs and symptoms of potential                         delayed complications were discussed with the patient.                         Return to normal activities tomorrow. Written                         discharge instructions were provided to the patient.                        - Discharge patient to home.                        - Resume previous diet.                        - Continue present medications.                        - No ibuprofen, naproxen, or other non-steroidal                         anti-inflammatory drugs.                        - Await pathology results.                        - Repeat colonoscopy for surveillance based on                         pathology results.                        - Return to GI office as previously scheduled.                        - The findings and recommendations were discussed with                         the patient. Procedure Code(s):     --- Professional ---                        [phone], Colonoscopy, flexible; with biopsy, single or                         multiple Diagnosis Code(s):     --- Professional ---                         K64.8, Other hemorrhoids                        K63.3, Ulcer of intestine  D12.2, Benign neoplasm of ascending colon                        K52.9, Noninfective gastroenteritis and colitis,                         unspecified CPT copyright 2022 American Medical Association. All rights reserved. The codes documented in this report are preliminary and upon coder review may  be revised to meet current compliance requirements. Attending Participation:      I personally performed the entire procedure. Elfredia Nevins, DO Jaynie Collins DO, DO 01/22/2023 1:59:17 PM This report has been signed electronically. Number of Addenda: 0 Note Initiated On: 01/22/2023 1:03 PM Scope Withdrawal Time: 0 hours 12 minutes 11 seconds  Total Procedure Duration: 0 hours 17 minutes 18 seconds  Estimated Blood Loss:  Estimated blood loss was minimal.      [facility]

## 2023-01-22 NOTE — Interval H&P Note (Signed)
History and Physical Interval Note: Preprocedure H&P from 01/22/23  was reviewed and there was no interval change after seeing and examining the patient.  Written consent was obtained from the patient after discussion of risks, benefits, and alternatives. Patient has consented to proceed with Esophagogastroduodenoscopy and Colonoscopy with possible intervention   01/22/2023 1:08 PM  Tyray Dereon  has presented today for surgery, with the diagnosis of  536.8 (ICD-9-CM) - K30 (ICD-10-CM) - Nonulcer dyspepsia  V76.51 (ICD-9-CM) - Z12.11 (ICD-10-CM) - Colon cancer screening  V16.0 (ICD-9-CM) - Z80.0 (ICD-10-CM) - Family history of gastric cancer  564.5 (ICD-9-CM) - K59.1 (ICD-10-CM) - Diarrhea, functional  530.81, 553.3 (ICD-9-CM) - K21.9, K44.9 (ICD-10-CM) - Gastroesophageal reflux disease with hiatal hernia.  The various methods of treatment have been discussed with the patient and family. After consideration of risks, benefits and other options for treatment, the patient has consented to  Procedure(s) with comments: COLONOSCOPY WITH PROPOFOL (N/A) - NEEDS CHINESE (MANDARIN) ESOPHAGOGASTRODUODENOSCOPY (EGD) WITH PROPOFOL (N/A) as a surgical intervention.  The patient's history has been reviewed, patient examined, no change in status, stable for surgery.  I have reviewed the patient's chart and labs.  Questions were answered to the patient's satisfaction.     Jaynie Collins

## 2023-01-22 NOTE — Anesthesia Procedure Notes (Signed)
Procedure Name: General with mask airway Date/Time: 01/22/2023 1:15 PM  Performed by: Mohammed Kindle, CRNAPre-anesthesia Checklist: Patient identified, Emergency Drugs available, Suction available and Patient being monitored Patient Re-evaluated:Patient Re-evaluated prior to induction Oxygen Delivery Method: Simple face mask Induction Type: IV induction Placement Confirmation: positive ETCO2, CO2 detector and breath sounds checked- equal and bilateral Dental Injury: Teeth and Oropharynx as per pre-operative assessment

## 2023-01-22 NOTE — H&P (Signed)
Pre-Procedure H&P   Patient ID: Robert Avery is a 62 y.o. male.  Gastroenterology Provider: Jaynie Collins, DO  Referring Provider: Jacob Moores, PA PCP: Sherron Monday, MD  Date: 01/22/2023  HPI Robert Avery is a 62 y.o. male who presents today for Esophagogastroduodenoscopy and Colonoscopy for Dyspepsia, family history of gastric cancer, GERD, diarrhea .  Information is garnered via chart review and video Chinese/Mandarin medical interpreter  Patient last underwent colonoscopy September 2014. He also underwent egd at that time.  The former demonstrated sigmoid diverticulosis and internal hemorrhoids.  The latter demonstrated grade C esophagitis and gastritis.  EGD in 2018 hiatal hernia and gastritis demonstrated  Patient was seen in the office noting postprandial fullness and intermittent diarrhea.  The diarrhea is controlled with Lomotil.  No melena or hematochezia. He has been negative for H. pylori x 2, but has a history of focal intestinal metaplasia.  GES and June 2018 normal.   Past Medical History:  Diagnosis Date   Atopic dermatitis    BPH (benign prostatic hyperplasia)    Diabetes mellitus without complication (HCC)    Pt takes Metformin   Fatty liver    GERD (gastroesophageal reflux disease)    Hyperlipidemia 03/22/2016   Hypertension    Lumbosacral radiculopathy at L4     Past Surgical History:  Procedure Laterality Date   ESOPHAGOGASTRODUODENOSCOPY (EGD) WITH PROPOFOL N/A 03/14/2017   Procedure: ESOPHAGOGASTRODUODENOSCOPY (EGD) WITH PROPOFOL;  Surgeon: Scot Jun, MD;  Location: [facility] ENDOSCOPY;  Service: Endoscopy;  Laterality: N/A;    Family History Father- gastric cancer 58 y/o No h/o GI disease or malignancy  Review of Systems  Constitutional:  Negative for activity change, appetite change, chills, diaphoresis, fatigue, fever and unexpected weight change.  HENT:  Negative for trouble swallowing and voice change.    Respiratory:  Negative for shortness of breath and wheezing.   Cardiovascular:  Negative for chest pain, palpitations and leg swelling.  Gastrointestinal:  Positive for diarrhea. Negative for abdominal distention, abdominal pain, anal bleeding, blood in stool, constipation, nausea and vomiting.  Musculoskeletal:  Negative for arthralgias and myalgias.  Skin:  Negative for color change and pallor.  Neurological:  Negative for dizziness, syncope and weakness.  Psychiatric/Behavioral:  Negative for confusion. The patient is not nervous/anxious.   All other systems reviewed and are negative.    Medications No current facility-administered medications on file prior to encounter.   Current Outpatient Medications on File Prior to Encounter  Medication Sig Dispense Refill   alfuzosin (UROXATRAL) 10 MG 24 hr tablet TAKE 1 TABLET (10 MG TOTAL) BY MOUTH DAILY WITH BREAKFAST. 90 tablet 0   pantoprazole (PROTONIX) 40 MG tablet Take 40 mg by mouth 2 (two) times daily. (Patient not taking: Reported on 09/27/2022)     rosuvastatin (CRESTOR) 5 MG tablet Take 5 mg by mouth daily.     valsartan-hydrochlorothiazide (DIOVAN-HCT) 320-25 MG tablet Take 1 tablet by mouth daily.      Pertinent medications related to GI and procedure were reviewed by me with the patient prior to the procedure   Current Facility-Administered Medications:    0.9 %  sodium chloride infusion, , Intravenous, Continuous, Jaynie Collins, DO  sodium chloride         No Known Allergies Allergies were reviewed by me prior to the procedure  Objective   Body mass index is 25.91 kg/m. Vitals:   01/22/23 1248  BP: (!) 144/76  Pulse: 63  Resp: 18  Temp: (!)  97 F (36.1 C)  TempSrc: Temporal  SpO2: 100%  Weight: 75 kg  Height: 5\' 7"  (1.702 m)     Physical Exam Vitals and nursing note reviewed.  Constitutional:      General: He is not in acute distress.    Appearance: Normal appearance. He is not ill-appearing,  toxic-appearing or diaphoretic.  HENT:     Head: Normocephalic and atraumatic.     Nose: Nose normal.     Mouth/Throat:     Mouth: Mucous membranes are moist.     Pharynx: Oropharynx is clear.  Eyes:     General: No scleral icterus.    Extraocular Movements: Extraocular movements intact.  Cardiovascular:     Rate and Rhythm: Normal rate and regular rhythm.     Heart sounds: Normal heart sounds. No murmur heard.    No friction rub. No gallop.  Pulmonary:     Effort: Pulmonary effort is normal. No respiratory distress.     Breath sounds: Normal breath sounds. No wheezing, rhonchi or rales.  Abdominal:     General: Bowel sounds are normal. There is no distension.     Palpations: Abdomen is soft.     Tenderness: There is no abdominal tenderness. There is no guarding or rebound.  Musculoskeletal:     Cervical back: Neck supple.     Right lower leg: No edema.     Left lower leg: No edema.  Skin:    General: Skin is warm and dry.     Coloration: Skin is not jaundiced or pale.  Neurological:     General: No focal deficit present.     Mental Status: He is alert and oriented to person, place, and time. Mental status is at baseline.  Psychiatric:        Mood and Affect: Mood normal.        Behavior: Behavior normal.        Thought Content: Thought content normal.        Judgment: Judgment normal.      Assessment:  Robert Avery is a 62 y.o. male  who presents today for Esophagogastroduodenoscopy and Colonoscopy for Dyspepsia, family history of gastric cancer, GERD, diarrhea .  Plan:  Esophagogastroduodenoscopy and Colonoscopy with possible intervention today  Esophagogastroduodenoscopy and Colonoscopy with possible biopsy, control of bleeding, polypectomy, and interventions as necessary has been discussed with the patient/patient representative. Informed consent was obtained from the patient/patient representative after explaining the indication, nature, and risks of the  procedure including but not limited to death, bleeding, perforation, missed neoplasm/lesions, cardiorespiratory compromise, and reaction to medications. Opportunity for questions was given and appropriate answers were provided. Patient/patient representative has verbalized understanding is amenable to undergoing the procedure.   Jaynie Collins, DO  [facility] Gastroenterology  Portions of the record may have been created with voice recognition software. Occasional wrong-word or 'sound-a-like' substitutions may have occurred due to the inherent limitations of voice recognition software.  Read the chart carefully and recognize, using context, where substitutions may have occurred.

## 2023-01-23 ENCOUNTER — Encounter: Payer: Self-pay | Admitting: Gastroenterology

## 2023-02-07 ENCOUNTER — Ambulatory Visit: Payer: 59 | Admitting: Internal Medicine

## 2023-02-14 DIAGNOSIS — Z789 Other specified health status: Secondary | ICD-10-CM | POA: Diagnosis not present

## 2023-02-14 DIAGNOSIS — K3189 Other diseases of stomach and duodenum: Secondary | ICD-10-CM | POA: Diagnosis not present

## 2023-02-14 DIAGNOSIS — K2980 Duodenitis without bleeding: Secondary | ICD-10-CM | POA: Diagnosis not present

## 2023-02-14 DIAGNOSIS — K529 Noninfective gastroenteritis and colitis, unspecified: Secondary | ICD-10-CM | POA: Diagnosis not present

## 2023-02-14 DIAGNOSIS — K2100 Gastro-esophageal reflux disease with esophagitis, without bleeding: Secondary | ICD-10-CM | POA: Diagnosis not present

## 2023-02-19 ENCOUNTER — Ambulatory Visit: Payer: 59 | Admitting: Internal Medicine

## 2023-02-19 VITALS — BP 135/90 | HR 59 | Ht 66.0 in | Wt 171.6 lb

## 2023-02-19 DIAGNOSIS — I10 Essential (primary) hypertension: Secondary | ICD-10-CM | POA: Diagnosis not present

## 2023-02-19 DIAGNOSIS — N401 Enlarged prostate with lower urinary tract symptoms: Secondary | ICD-10-CM

## 2023-02-19 DIAGNOSIS — N138 Other obstructive and reflux uropathy: Secondary | ICD-10-CM

## 2023-02-19 DIAGNOSIS — E782 Mixed hyperlipidemia: Secondary | ICD-10-CM

## 2023-02-19 DIAGNOSIS — E119 Type 2 diabetes mellitus without complications: Secondary | ICD-10-CM | POA: Diagnosis not present

## 2023-02-19 LAB — POCT CBG (FASTING - GLUCOSE)-MANUAL ENTRY: Glucose Fasting, POC: 117 mg/dL — AB (ref 70–99)

## 2023-02-19 MED ORDER — OLMESARTAN MEDOXOMIL 40 MG PO TABS
40.0000 mg | ORAL_TABLET | Freq: Every day | ORAL | 1 refills | Status: DC
Start: 2023-02-19 — End: 2023-04-10

## 2023-02-19 MED ORDER — OLMESARTAN MEDOXOMIL 40 MG PO TABS
40.0000 mg | ORAL_TABLET | Freq: Every day | ORAL | 0 refills | Status: DC
Start: 2023-02-19 — End: 2023-02-19

## 2023-02-19 NOTE — Progress Notes (Signed)
Established Patient Office Visit  Subjective:  Patient ID: Robert Avery, male    DOB: Apr 23, 1961  Age: 62 y.o. MRN: [id number]  Chief Complaint  Patient presents with   Follow-up    6 week lab F/U    C/o labile bp at home with a dizzy spell with hypotension on home bp monitor of 78 systolic.    No other concerns at this time.   Past Medical History:  Diagnosis Date   Atopic dermatitis    BPH (benign prostatic hyperplasia)    Diabetes mellitus without complication (HCC)    Pt takes Metformin   Fatty liver    GERD (gastroesophageal reflux disease)    Hyperlipidemia 03/22/2016   Hypertension    Lumbosacral radiculopathy at L4     Past Surgical History:  Procedure Laterality Date   BIOPSY  01/22/2023   Procedure: BIOPSY;  Surgeon: Jaynie Collins, DO;  Location: [facility] ENDOSCOPY;  Service: Gastroenterology;;   COLONOSCOPY WITH PROPOFOL N/A 01/22/2023   Procedure: COLONOSCOPY WITH PROPOFOL;  Surgeon: Jaynie Collins, DO;  Location: [facility] ENDOSCOPY;  Service: Gastroenterology;  Laterality: N/A;  NEEDS CHINESE (MANDARIN)   ESOPHAGOGASTRODUODENOSCOPY (EGD) WITH PROPOFOL N/A 03/14/2017   Procedure: ESOPHAGOGASTRODUODENOSCOPY (EGD) WITH PROPOFOL;  Surgeon: Scot Jun, MD;  Location: [facility] ENDOSCOPY;  Service: Endoscopy;  Laterality: N/A;   ESOPHAGOGASTRODUODENOSCOPY (EGD) WITH PROPOFOL N/A 01/22/2023   Procedure: ESOPHAGOGASTRODUODENOSCOPY (EGD) WITH PROPOFOL;  Surgeon: Jaynie Collins, DO;  Location: [facility] ENDOSCOPY;  Service: Gastroenterology;  Laterality: N/A;   POLYPECTOMY  01/22/2023   Procedure: POLYPECTOMY;  Surgeon: Jaynie Collins, DO;  Location: [facility] ENDOSCOPY;  Service: Gastroenterology;;    Social History   Socioeconomic History   Marital status: Married    Spouse name: Not on file   Number of children: Not on file   Years of education: Not on file   Highest education level: Not on file  Occupational History   Not on file  Tobacco Use    Smoking status: Never   Smokeless tobacco: Never  Vaping Use   Vaping status: Never Used  Substance and Sexual Activity   Alcohol use: No   Drug use: No   Sexual activity: Not on file  Other Topics Concern   Not on file  Social History Narrative   Not on file   Social Determinants of Health   Financial Resource Strain: Not on file  Food Insecurity: Not on file  Transportation Needs: Not on file  Physical Activity: Not on file  Stress: Not on file  Social Connections: Not on file  Intimate Partner Violence: Not on file    No family history on file.  No Known Allergies  Review of Systems  Constitutional: Negative.   HENT: Negative.    Eyes: Negative.   Respiratory: Negative.    Cardiovascular: Negative.   Gastrointestinal: Negative.   Genitourinary: Negative.   Skin: Negative.   Neurological: Negative.   Endo/Heme/Allergies: Negative.        Objective:   BP (!) 135/90   Pulse (!) 59   Ht 5\' 6"  (1.676 m)   Wt 171 lb 9.6 oz (77.8 kg)   SpO2 99%   BMI 27.70 kg/m   Vitals:   02/19/23 0927  BP: (!) 135/90  Pulse: (!) 59  Height: 5\' 6"  (1.676 m)  Weight: 171 lb 9.6 oz (77.8 kg)  SpO2: 99%  BMI (Calculated): 27.71    Physical Exam Vitals reviewed.  Constitutional:      Appearance: Normal appearance.  HENT:     Head: Normocephalic.     Left Ear: There is no impacted cerumen.     Nose: Nose normal.     Mouth/Throat:     Mouth: Mucous membranes are moist.     Pharynx: No posterior oropharyngeal erythema.  Eyes:     Extraocular Movements: Extraocular movements intact.     Pupils: Pupils are equal, round, and reactive to light.  Cardiovascular:     Rate and Rhythm: Regular rhythm.     Chest Wall: PMI is not displaced.     Pulses: Normal pulses.     Heart sounds: Normal heart sounds. No murmur heard. Pulmonary:     Effort: Pulmonary effort is normal.     Breath sounds: Normal air entry. No rhonchi or rales.  Abdominal:     General: Abdomen is  flat. Bowel sounds are normal. There is no distension.     Palpations: Abdomen is soft. There is no hepatomegaly, splenomegaly or mass.     Tenderness: There is no abdominal tenderness.  Musculoskeletal:        General: Normal range of motion.     Cervical back: Normal range of motion and neck supple.     Right lower leg: No edema.     Left lower leg: No edema.  Skin:    General: Skin is warm and dry.     Comments: Onychomycosis of both big toenails  Neurological:     General: No focal deficit present.     Mental Status: He is alert and oriented to person, place, and time.     Cranial Nerves: No cranial nerve deficit.     Motor: No weakness.  Psychiatric:        Mood and Affect: Mood normal.        Behavior: Behavior normal.      Results for orders placed or performed in visit on 02/19/23  POCT CBG (Fasting - Glucose)  Result Value Ref Range   Glucose Fasting, POC 117 (A) 70 - 99 mg/dL        Assessment & Plan:  As per problem list. Dc hydrochlorothiazide. Problem List Items Addressed This Visit       Cardiovascular and Mediastinum   Primary hypertension   Relevant Medications   olmesartan (BENICAR) 40 MG tablet   Other Relevant Orders   Comprehensive metabolic panel     Endocrine   Type 2 diabetes mellitus without complication, without long-term current use of insulin (HCC) - Primary   Relevant Medications   olmesartan (BENICAR) 40 MG tablet   Other Relevant Orders   POCT CBG (Fasting - Glucose) (Completed)   Lipid panel   Hemoglobin A1c     Genitourinary   BPH with obstruction/lower urinary tract symptoms   Other Visit Diagnoses     Mixed hyperlipidemia       Relevant Medications   olmesartan (BENICAR) 40 MG tablet       Return in about 6 weeks (around 04/02/2023) for fu with labs prior.   Total time spent: 20 minutes  Luna Fuse, MD  02/19/2023   This document may have been prepared by [facility] Voice Recognition software and as such  may include unintentional dictation errors.

## 2023-02-20 LAB — FRUCTOSAMINE: Fructosamine: 245 umol/L (ref 0–285)

## 2023-02-22 ENCOUNTER — Other Ambulatory Visit: Payer: Self-pay | Admitting: Internal Medicine

## 2023-02-22 DIAGNOSIS — E118 Type 2 diabetes mellitus with unspecified complications: Secondary | ICD-10-CM

## 2023-02-23 NOTE — Progress Notes (Signed)
Spoke with pt who verbalized understanding.

## 2023-03-01 ENCOUNTER — Other Ambulatory Visit: Payer: Self-pay | Admitting: Internal Medicine

## 2023-03-01 DIAGNOSIS — E119 Type 2 diabetes mellitus without complications: Secondary | ICD-10-CM

## 2023-03-05 ENCOUNTER — Other Ambulatory Visit: Payer: Self-pay | Admitting: Internal Medicine

## 2023-03-06 ENCOUNTER — Other Ambulatory Visit: Payer: Self-pay | Admitting: Family

## 2023-03-06 DIAGNOSIS — L509 Urticaria, unspecified: Secondary | ICD-10-CM

## 2023-03-06 DIAGNOSIS — L235 Allergic contact dermatitis due to other chemical products: Secondary | ICD-10-CM

## 2023-04-01 ENCOUNTER — Other Ambulatory Visit: Payer: Self-pay | Admitting: Internal Medicine

## 2023-04-01 DIAGNOSIS — E119 Type 2 diabetes mellitus without complications: Secondary | ICD-10-CM

## 2023-04-09 ENCOUNTER — Other Ambulatory Visit: Payer: 59

## 2023-04-09 DIAGNOSIS — I10 Essential (primary) hypertension: Secondary | ICD-10-CM

## 2023-04-09 DIAGNOSIS — E119 Type 2 diabetes mellitus without complications: Secondary | ICD-10-CM

## 2023-04-10 ENCOUNTER — Ambulatory Visit (INDEPENDENT_AMBULATORY_CARE_PROVIDER_SITE_OTHER): Payer: 59 | Admitting: Internal Medicine

## 2023-04-10 ENCOUNTER — Encounter: Payer: Self-pay | Admitting: Internal Medicine

## 2023-04-10 VITALS — BP 130/85 | HR 61 | Ht 66.0 in | Wt 169.6 lb

## 2023-04-10 DIAGNOSIS — E119 Type 2 diabetes mellitus without complications: Secondary | ICD-10-CM

## 2023-04-10 DIAGNOSIS — I10 Essential (primary) hypertension: Secondary | ICD-10-CM

## 2023-04-10 DIAGNOSIS — N138 Other obstructive and reflux uropathy: Secondary | ICD-10-CM | POA: Diagnosis not present

## 2023-04-10 DIAGNOSIS — N401 Enlarged prostate with lower urinary tract symptoms: Secondary | ICD-10-CM

## 2023-04-10 DIAGNOSIS — E782 Mixed hyperlipidemia: Secondary | ICD-10-CM

## 2023-04-10 LAB — COMPREHENSIVE METABOLIC PANEL
ALT: 22 IU/L (ref 0–44)
AST: 16 IU/L (ref 0–40)
Albumin: 4.8 g/dL (ref 3.8–4.9)
Alkaline Phosphatase: 60 IU/L (ref 44–121)
BUN/Creatinine Ratio: 21 (ref 10–24)
BUN: 17 mg/dL (ref 8–27)
Bilirubin Total: 0.5 mg/dL (ref 0.0–1.2)
CO2: 24 mmol/L (ref 20–29)
Calcium: 9.6 mg/dL (ref 8.6–10.2)
Chloride: 101 mmol/L (ref 96–106)
Creatinine, Ser: 0.82 mg/dL (ref 0.76–1.27)
Globulin, Total: 2.3 g/dL (ref 1.5–4.5)
Glucose: 106 mg/dL — ABNORMAL HIGH (ref 70–99)
Potassium: 4.8 mmol/L (ref 3.5–5.2)
Sodium: 141 mmol/L (ref 134–144)
Total Protein: 7.1 g/dL (ref 6.0–8.5)
eGFR: 101 mL/min/{1.73_m2} (ref >59)

## 2023-04-10 LAB — LIPID PANEL
Chol/HDL Ratio: 3.6 ratio (ref 0.0–5.0)
Cholesterol, Total: 169 mg/dL (ref 100–199)
HDL: 47 mg/dL (ref >39)
LDL Chol Calc (NIH): 74 mg/dL (ref 0–99)
Triglycerides: 299 mg/dL — ABNORMAL HIGH (ref 0–149)
VLDL Cholesterol Cal: 48 mg/dL — ABNORMAL HIGH (ref 5–40)

## 2023-04-10 LAB — POCT CBG (FASTING - GLUCOSE)-MANUAL ENTRY: Glucose Fasting, POC: 110 mg/dL — AB (ref 70–99)

## 2023-04-10 LAB — HEMOGLOBIN A1C
Est. average glucose Bld gHb Est-mCnc: 128 mg/dL
Hgb A1c MFr Bld: 6.1 % — ABNORMAL HIGH (ref 4.8–5.6)

## 2023-04-10 MED ORDER — DUTASTERIDE 0.5 MG PO CAPS
0.5000 mg | ORAL_CAPSULE | Freq: Every day | ORAL | 1 refills | Status: AC
Start: 2023-04-10 — End: 2023-10-07

## 2023-04-10 MED ORDER — OLMESARTAN MEDOXOMIL 40 MG PO TABS
40.0000 mg | ORAL_TABLET | Freq: Every day | ORAL | 0 refills | Status: DC
Start: 2023-04-10 — End: 2023-07-17

## 2023-04-10 MED ORDER — TAMSULOSIN HCL 0.4 MG PO CAPS
0.4000 mg | ORAL_CAPSULE | Freq: Every day | ORAL | 1 refills | Status: AC
Start: 2023-04-10 — End: 2023-10-07

## 2023-04-10 NOTE — Progress Notes (Signed)
Established Patient Office Visit  Subjective:  Patient ID: Robert Avery, male    DOB: [birth year]  Age: 62 y.o. MRN: [id number]  Chief Complaint  Patient presents with   Follow-up    6 week lab results    No new complaints, here for lab review and medication refills. Labs reviewed and notable for well controlled diabetes, A1c now at target. Denies any hypoglycemic episodes and home bg readings have been at target. LDL and TC well controlled on lab review. Triglycerides elevated however to which he admits dietary indiscretion.     No other concerns at this time.   Past Medical History:  Diagnosis Date   Atopic dermatitis    BPH (benign prostatic hyperplasia)    Diabetes mellitus without complication (HCC)    Pt takes Metformin   Fatty liver    GERD (gastroesophageal reflux disease)    Hyperlipidemia 03/22/2016   Hypertension    Lumbosacral radiculopathy at L4     Past Surgical History:  Procedure Laterality Date   BIOPSY  01/22/2023   Procedure: BIOPSY;  Surgeon: Jaynie Collins, DO;  Location: [facility] ENDOSCOPY;  Service: Gastroenterology;;   COLONOSCOPY WITH PROPOFOL N/A 01/22/2023   Procedure: COLONOSCOPY WITH PROPOFOL;  Surgeon: Jaynie Collins, DO;  Location: [facility] ENDOSCOPY;  Service: Gastroenterology;  Laterality: N/A;  NEEDS CHINESE (MANDARIN)   ESOPHAGOGASTRODUODENOSCOPY (EGD) WITH PROPOFOL N/A 03/14/2017   Procedure: ESOPHAGOGASTRODUODENOSCOPY (EGD) WITH PROPOFOL;  Surgeon: Scot Jun, MD;  Location: [facility] ENDOSCOPY;  Service: Endoscopy;  Laterality: N/A;   ESOPHAGOGASTRODUODENOSCOPY (EGD) WITH PROPOFOL N/A 01/22/2023   Procedure: ESOPHAGOGASTRODUODENOSCOPY (EGD) WITH PROPOFOL;  Surgeon: Jaynie Collins, DO;  Location: [facility] ENDOSCOPY;  Service: Gastroenterology;  Laterality: N/A;   POLYPECTOMY  01/22/2023   Procedure: POLYPECTOMY;  Surgeon: Jaynie Collins, DO;  Location: [facility] ENDOSCOPY;  Service: Gastroenterology;;    Social History    Socioeconomic History   Marital status: Married    Spouse name: Not on file   Number of children: Not on file   Years of education: Not on file   Highest education level: Not on file  Occupational History   Not on file  Tobacco Use   Smoking status: Never   Smokeless tobacco: Never  Vaping Use   Vaping status: Never Used  Substance and Sexual Activity   Alcohol use: No   Drug use: No   Sexual activity: Not on file  Other Topics Concern   Not on file  Social History Narrative   Not on file   Social Determinants of Health   Financial Resource Strain: Not on file  Food Insecurity: Not on file  Transportation Needs: Not on file  Physical Activity: Not on file  Stress: Not on file  Social Connections: Not on file  Intimate Partner Violence: Not on file    No family history on file.  No Known Allergies  Review of Systems  Constitutional: Negative.   HENT: Negative.    Eyes: Negative.   Respiratory: Negative.    Cardiovascular: Negative.   Gastrointestinal: Negative.   Genitourinary: Negative.   Skin: Negative.   Neurological: Negative.   Endo/Heme/Allergies: Negative.        Objective:   BP 130/85   Pulse 61   Ht 5\' 6"  (1.676 m)   Wt 169 lb 9.6 oz (76.9 kg)   SpO2 99%   BMI 27.37 kg/m   Vitals:   04/10/23 0945  BP: 130/85  Pulse: 61  Height: 5\' 6"  (1.676 m)  Weight:  169 lb 9.6 oz (76.9 kg)  SpO2: 99%  BMI (Calculated): 27.39    Physical Exam Vitals reviewed.  Constitutional:      Appearance: Normal appearance.  HENT:     Head: Normocephalic.     Left Ear: There is no impacted cerumen.     Nose: Nose normal.     Mouth/Throat:     Mouth: Mucous membranes are moist.     Pharynx: No posterior oropharyngeal erythema.  Eyes:     Extraocular Movements: Extraocular movements intact.     Pupils: Pupils are equal, round, and reactive to light.  Cardiovascular:     Rate and Rhythm: Regular rhythm.     Chest Wall: PMI is not displaced.      Pulses: Normal pulses.     Heart sounds: Normal heart sounds. No murmur heard. Pulmonary:     Effort: Pulmonary effort is normal.     Breath sounds: Normal air entry. No rhonchi or rales.  Abdominal:     General: Abdomen is flat. Bowel sounds are normal. There is no distension.     Palpations: Abdomen is soft. There is no hepatomegaly, splenomegaly or mass.     Tenderness: There is no abdominal tenderness.  Musculoskeletal:        General: Normal range of motion.     Cervical back: Normal range of motion and neck supple.     Right lower leg: No edema.     Left lower leg: No edema.  Skin:    General: Skin is warm and dry.     Comments: Onychomycosis of both big toenails  Neurological:     General: No focal deficit present.     Mental Status: He is alert and oriented to person, place, and time.     Cranial Nerves: No cranial nerve deficit.     Motor: No weakness.  Psychiatric:        Mood and Affect: Mood normal.        Behavior: Behavior normal.      Results for orders placed or performed in visit on 04/10/23  POCT CBG (Fasting - Glucose)  Result Value Ref Range   Glucose Fasting, POC 110 (A) 70 - 99 mg/dL    Recent Results (from the past 2160 hour(Julane Crock))  Glucose, capillary     Status: Abnormal   Collection Time: 01/22/23 12:46 PM  Result Value Ref Range   Glucose-Capillary 103 (H) 70 - 99 mg/dL    Comment: Glucose reference range applies only to samples taken after fasting for at least 8 hours.  POCT CBG (Fasting - Glucose)     Status: Abnormal   Collection Time: 02/19/23  9:33 AM  Result Value Ref Range   Glucose Fasting, POC 117 (A) 70 - 99 mg/dL  Fructosamine     Status: None   Collection Time: 02/19/23 10:36 AM  Result Value Ref Range   Fructosamine 245 0 - 285 umol/L    Comment: Published reference interval for apparently healthy subjects between age 35 and 35 is 47 - 285 umol/L and in a poorly controlled diabetic population is 228 - 563 umol/L with a mean of  396 umol/L.   Hemoglobin A1c     Status: Abnormal   Collection Time: 04/09/23  8:47 AM  Result Value Ref Range   Hgb A1c MFr Bld 6.1 (H) 4.8 - 5.6 %    Comment:          Prediabetes: 5.7 - 6.4  Diabetes: >6.4          Glycemic control for adults with diabetes: <7.0    Est. average glucose Bld gHb Est-mCnc 128 mg/dL  Lipid panel     Status: Abnormal   Collection Time: 04/09/23  8:47 AM  Result Value Ref Range   Cholesterol, Total 169 100 - 199 mg/dL   Triglycerides 952 (H) 0 - 149 mg/dL   HDL 47 >84 mg/dL   VLDL Cholesterol Cal 48 (H) 5 - 40 mg/dL   LDL Chol Calc (NIH) 74 0 - 99 mg/dL   Chol/HDL Ratio 3.6 0.0 - 5.0 ratio    Comment:                                   T. Chol/HDL Ratio                                             Men  Women                               1/2 Avg.Risk  3.4    3.3                                   Avg.Risk  5.0    4.4                                2X Avg.Risk  9.6    7.1                                3X Avg.Risk 23.4   11.0   Comprehensive metabolic panel     Status: Abnormal   Collection Time: 04/09/23  8:47 AM  Result Value Ref Range   Glucose 106 (H) 70 - 99 mg/dL   BUN 17 8 - 27 mg/dL   Creatinine, Ser 1.32 0.76 - 1.27 mg/dL   eGFR 440 >10 UV/OZD/6.64   BUN/Creatinine Ratio 21 10 - 24   Sodium 141 134 - 144 mmol/L   Potassium 4.8 3.5 - 5.2 mmol/L   Chloride 101 96 - 106 mmol/L   CO2 24 20 - 29 mmol/L   Calcium 9.6 8.6 - 10.2 mg/dL   Total Protein 7.1 6.0 - 8.5 g/dL   Albumin 4.8 3.8 - 4.9 g/dL   Globulin, Total 2.3 1.5 - 4.5 g/dL   Bilirubin Total 0.5 0.0 - 1.2 mg/dL   Alkaline Phosphatase 60 44 - 121 IU/L   AST 16 0 - 40 IU/L   ALT 22 0 - 44 IU/L  POCT CBG (Fasting - Glucose)     Status: Abnormal   Collection Time: 04/10/23  9:48 AM  Result Value Ref Range   Glucose Fasting, POC 110 (A) 70 - 99 mg/dL      Assessment & Plan:  As per problem list  Problem List Items Addressed This Visit       Endocrine   Type 2  diabetes mellitus without complication, without long-term current use of insulin (HCC) - Primary   Relevant Orders  POCT CBG (Fasting - Glucose) (Completed)    No follow-ups on file.   Total time spent: 20 minutes  Luna Fuse, MD  04/10/2023   This document may have been prepared by [facility] Voice Recognition software and as such may include unintentional dictation errors.

## 2023-07-13 ENCOUNTER — Other Ambulatory Visit: Payer: 59

## 2023-07-13 DIAGNOSIS — I10 Essential (primary) hypertension: Secondary | ICD-10-CM

## 2023-07-13 DIAGNOSIS — N401 Enlarged prostate with lower urinary tract symptoms: Secondary | ICD-10-CM | POA: Diagnosis not present

## 2023-07-13 DIAGNOSIS — N138 Other obstructive and reflux uropathy: Secondary | ICD-10-CM

## 2023-07-13 DIAGNOSIS — E782 Mixed hyperlipidemia: Secondary | ICD-10-CM

## 2023-07-13 DIAGNOSIS — E119 Type 2 diabetes mellitus without complications: Secondary | ICD-10-CM

## 2023-07-14 LAB — COMPREHENSIVE METABOLIC PANEL
ALT: 30 [IU]/L (ref 0–44)
AST: 22 [IU]/L (ref 0–40)
Albumin: 4.8 g/dL (ref 3.9–4.9)
Alkaline Phosphatase: 55 [IU]/L (ref 44–121)
BUN/Creatinine Ratio: 24 (ref 10–24)
BUN: 20 mg/dL (ref 8–27)
Bilirubin Total: 0.5 mg/dL (ref 0.0–1.2)
CO2: 22 mmol/L (ref 20–29)
Calcium: 10.1 mg/dL (ref 8.6–10.2)
Chloride: 100 mmol/L (ref 96–106)
Creatinine, Ser: 0.83 mg/dL (ref 0.76–1.27)
Globulin, Total: 2.1 g/dL (ref 1.5–4.5)
Glucose: 115 mg/dL — ABNORMAL HIGH (ref 70–99)
Potassium: 4.4 mmol/L (ref 3.5–5.2)
Sodium: 140 mmol/L (ref 134–144)
Total Protein: 6.9 g/dL (ref 6.0–8.5)
eGFR: 100 mL/min/{1.73_m2} (ref >59)

## 2023-07-14 LAB — HEMOGLOBIN A1C
Est. average glucose Bld gHb Est-mCnc: 166 mg/dL
Hgb A1c MFr Bld: 7.4 % — ABNORMAL HIGH (ref 4.8–5.6)

## 2023-07-14 LAB — CBC WITH DIFF/PLATELET
Basophils Absolute: 0 10*3/uL (ref 0.0–0.2)
Basos: 0 %
EOS (ABSOLUTE): 0.1 10*3/uL (ref 0.0–0.4)
Eos: 1 %
Hematocrit: 41.7 % (ref 37.5–51.0)
Hemoglobin: 13.4 g/dL (ref 13.0–17.7)
Immature Grans (Abs): 0 10*3/uL (ref 0.0–0.1)
Immature Granulocytes: 1 %
Lymphocytes Absolute: 2.1 10*3/uL (ref 0.7–3.1)
Lymphs: 36 %
MCH: 29.1 pg (ref 26.6–33.0)
MCHC: 32.1 g/dL (ref 31.5–35.7)
MCV: 91 fL (ref 79–97)
Monocytes Absolute: 0.5 10*3/uL (ref 0.1–0.9)
Monocytes: 8 %
Neutrophils Absolute: 3 10*3/uL (ref 1.4–7.0)
Neutrophils: 54 %
Platelets: 229 10*3/uL (ref 150–450)
RBC: 4.61 x10E6/uL (ref 4.14–5.80)
RDW: 14.5 % (ref 11.6–15.4)
WBC: 5.7 10*3/uL (ref 3.4–10.8)

## 2023-07-14 LAB — LIPID PANEL
Chol/HDL Ratio: 4 {ratio} (ref 0.0–5.0)
Cholesterol, Total: 181 mg/dL (ref 100–199)
HDL: 45 mg/dL (ref >39)
LDL Chol Calc (NIH): 96 mg/dL (ref 0–99)
Triglycerides: 234 mg/dL — ABNORMAL HIGH (ref 0–149)
VLDL Cholesterol Cal: 40 mg/dL (ref 5–40)

## 2023-07-14 LAB — PSA: Prostate Specific Ag, Serum: 3.1 ng/mL (ref 0.0–4.0)

## 2023-07-17 ENCOUNTER — Ambulatory Visit (INDEPENDENT_AMBULATORY_CARE_PROVIDER_SITE_OTHER): Payer: 59 | Admitting: Internal Medicine

## 2023-07-17 VITALS — BP 112/74 | HR 66 | Ht 67.0 in | Wt 179.6 lb

## 2023-07-17 DIAGNOSIS — Z1331 Encounter for screening for depression: Secondary | ICD-10-CM | POA: Diagnosis not present

## 2023-07-17 DIAGNOSIS — Z23 Encounter for immunization: Secondary | ICD-10-CM

## 2023-07-17 DIAGNOSIS — I10 Essential (primary) hypertension: Secondary | ICD-10-CM | POA: Diagnosis not present

## 2023-07-17 DIAGNOSIS — Z0001 Encounter for general adult medical examination with abnormal findings: Secondary | ICD-10-CM | POA: Diagnosis not present

## 2023-07-17 DIAGNOSIS — E119 Type 2 diabetes mellitus without complications: Secondary | ICD-10-CM | POA: Diagnosis not present

## 2023-07-17 LAB — POCT CBG (FASTING - GLUCOSE)-MANUAL ENTRY: Glucose Fasting, POC: 126 mg/dL — AB (ref 70–99)

## 2023-07-17 MED ORDER — METFORMIN HCL 1000 MG PO TABS
1000.0000 mg | ORAL_TABLET | Freq: Two times a day (BID) | ORAL | 0 refills | Status: DC
Start: 2023-07-17 — End: 2023-09-17

## 2023-07-17 MED ORDER — OLMESARTAN MEDOXOMIL 40 MG PO TABS
40.0000 mg | ORAL_TABLET | Freq: Every day | ORAL | 0 refills | Status: DC
Start: 2023-07-17 — End: 2023-10-12

## 2023-07-17 MED ORDER — GLIPIZIDE 5 MG PO TABS
10.0000 mg | ORAL_TABLET | Freq: Every day | ORAL | 0 refills | Status: DC
Start: 2023-07-17 — End: 2023-08-15

## 2023-07-17 NOTE — Progress Notes (Signed)
Established Patient Office Visit  Subjective:  Patient ID: Robert Avery, male    DOB: Apr 27, 1961  Age: 62 y.o. MRN: [id number]  Chief Complaint  Patient presents with   Annual Exam    CPE, discuss lab results.    No new complaints, here for CPE, lab review and medication refills. Recently returned from [place] and admits to dietary indiscretion with weight gain.  Labs reviewed and notable for uncontrolled diabetes, A1c no longer at target, lipids at target with unremarkable cmp. PSA and CBC normal. Denies any hypoglycemic episodes.    No other concerns at this time.   Past Medical History:  Diagnosis Date   Atopic dermatitis    BPH (benign prostatic hyperplasia)    Diabetes mellitus without complication (HCC)    Pt takes Metformin   Fatty liver    GERD (gastroesophageal reflux disease)    Hyperlipidemia 03/22/2016   Hypertension    Lumbosacral radiculopathy at L4     Past Surgical History:  Procedure Laterality Date   BIOPSY  01/22/2023   Procedure: BIOPSY;  Surgeon: Jaynie Collins, DO;  Location: [facility] ENDOSCOPY;  Service: Gastroenterology;;   COLONOSCOPY WITH PROPOFOL N/A 01/22/2023   Procedure: COLONOSCOPY WITH PROPOFOL;  Surgeon: Jaynie Collins, DO;  Location: [facility] ENDOSCOPY;  Service: Gastroenterology;  Laterality: N/A;  NEEDS CHINESE (MANDARIN)   ESOPHAGOGASTRODUODENOSCOPY (EGD) WITH PROPOFOL N/A 03/14/2017   Procedure: ESOPHAGOGASTRODUODENOSCOPY (EGD) WITH PROPOFOL;  Surgeon: Scot Jun, MD;  Location: [facility] ENDOSCOPY;  Service: Endoscopy;  Laterality: N/A;   ESOPHAGOGASTRODUODENOSCOPY (EGD) WITH PROPOFOL N/A 01/22/2023   Procedure: ESOPHAGOGASTRODUODENOSCOPY (EGD) WITH PROPOFOL;  Surgeon: Jaynie Collins, DO;  Location: [facility] ENDOSCOPY;  Service: Gastroenterology;  Laterality: N/A;   POLYPECTOMY  01/22/2023   Procedure: POLYPECTOMY;  Surgeon: Jaynie Collins, DO;  Location: [facility] ENDOSCOPY;  Service: Gastroenterology;;    Social History    Socioeconomic History   Marital status: Married    Spouse name: Not on file   Number of children: Not on file   Years of education: Not on file   Highest education level: Not on file  Occupational History   Not on file  Tobacco Use   Smoking status: Never   Smokeless tobacco: Never  Vaping Use   Vaping status: Never Used  Substance and Sexual Activity   Alcohol use: No   Drug use: No   Sexual activity: Not on file  Other Topics Concern   Not on file  Social History Narrative   Not on file   Social Drivers of Health   Financial Resource Strain: Not on file  Food Insecurity: Not on file  Transportation Needs: Not on file  Physical Activity: Not on file  Stress: Not on file  Social Connections: Not on file  Intimate Partner Violence: Not on file    No family history on file.  No Known Allergies  Outpatient Medications Prior to Visit  Medication Sig   alfuzosin (UROXATRAL) 10 MG 24 hr tablet TAKE 1 TABLET (10 MG TOTAL) BY MOUTH DAILY WITH BREAKFAST.   diphenoxylate-atropine (LOMOTIL) 2.5-0.025 MG tablet USE AS DIRECTED 1 TAB AFTER EACH BOWEL MOVEMENT, MAX 8 TABS/DAY   dutasteride (AVODART) 0.5 MG capsule Take 1 capsule (0.5 mg total) by mouth daily.   mesalamine (LIALDA) 1.2 g EC tablet Take 2.4 g by mouth daily with breakfast.   pantoprazole (PROTONIX) 40 MG tablet Take 40 mg by mouth 2 (two) times daily.   pioglitazone (ACTOS) 45 MG tablet Take 1 tablet (45 mg  total) by mouth daily.   rosuvastatin (CRESTOR) 20 MG tablet TAKE ONE TABLET BY MOUTH ONCE DAILY * NOTE INCREASE & D/C NEXLIZET   tamsulosin (FLOMAX) 0.4 MG CAPS capsule Take 1 capsule (0.4 mg total) by mouth daily after breakfast.   [DISCONTINUED] glipiZIDE (GLUCOTROL) 5 MG tablet TAKE 1 TABLET BY MOUTH DAILY BEFORE BREAKFAST.   [DISCONTINUED] metFORMIN (GLUCOPHAGE) 1000 MG tablet TAKE 1 TABLET BY MOUTH TWICE A DAY   [DISCONTINUED] olmesartan (BENICAR) 40 MG tablet Take 1 tablet (40 mg total) by mouth daily.    No facility-administered medications prior to visit.    Review of Systems  Constitutional: Negative.  Negative for weight loss (gained 10 lbs).  HENT: Negative.    Eyes: Negative.   Respiratory: Negative.    Cardiovascular: Negative.   Gastrointestinal: Negative.   Genitourinary: Negative.   Skin: Negative.   Neurological: Negative.   Endo/Heme/Allergies: Negative.        Objective:   BP 112/74 (Cuff Size: Normal)   Pulse 66   Ht 5\' 7"  (1.702 m)   Wt 179 lb 9.6 oz (81.5 kg)   SpO2 97%   BMI 28.13 kg/m   Vitals:   07/17/23 0958 07/17/23 1031  BP: (!) 150/94 112/74  Pulse: 66   Height: 5\' 7"  (1.702 m)   Weight: 179 lb 9.6 oz (81.5 kg)   SpO2: 97%   BMI (Calculated): 28.12     Physical Exam Vitals reviewed.  Constitutional:      Appearance: Normal appearance.  HENT:     Head: Normocephalic.     Left Ear: There is no impacted cerumen.     Nose: Nose normal.     Mouth/Throat:     Mouth: Mucous membranes are moist.     Pharynx: No posterior oropharyngeal erythema.  Eyes:     Extraocular Movements: Extraocular movements intact.     Pupils: Pupils are equal, round, and reactive to light.  Cardiovascular:     Rate and Rhythm: Regular rhythm.     Chest Wall: PMI is not displaced.     Pulses: Normal pulses.     Heart sounds: Normal heart sounds. No murmur heard. Pulmonary:     Effort: Pulmonary effort is normal.     Breath sounds: Normal air entry. No rhonchi or rales.  Abdominal:     General: Abdomen is flat. Bowel sounds are normal. There is no distension.     Palpations: Abdomen is soft. There is no hepatomegaly, splenomegaly or mass.     Tenderness: There is no abdominal tenderness.  Musculoskeletal:        General: Normal range of motion.     Cervical back: Normal range of motion and neck supple.     Right lower leg: No edema.     Left lower leg: No edema.  Skin:    General: Skin is warm and dry.     Comments: Onychomycosis of both big toenails   Neurological:     General: No focal deficit present.     Mental Status: He is alert and oriented to person, place, and time.     Cranial Nerves: No cranial nerve deficit.     Motor: No weakness.  Psychiatric:        Mood and Affect: Mood normal.        Behavior: Behavior normal.      Results for orders placed or performed in visit on 07/17/23  POCT CBG (Fasting - Glucose)  Result Value Ref Range  Glucose Fasting, POC 126 (A) 70 - 99 mg/dL    Recent Results (from the past 2160 hours)  PSA     Status: None   Collection Time: 07/13/23 11:40 AM  Result Value Ref Range   Prostate Specific Ag, Serum 3.1 0.0 - 4.0 ng/mL    Comment: Roche ECLIA methodology. According to the American Urological Association, Serum PSA should decrease and remain at undetectable levels after radical prostatectomy. The AUA defines biochemical recurrence as an initial PSA value 0.2 ng/mL or greater followed by a subsequent confirmatory PSA value 0.2 ng/mL or greater. Values obtained with different assay methods or kits cannot be used interchangeably. Results cannot be interpreted as absolute evidence of the presence or absence of malignant disease.   CBC With Diff/Platelet     Status: None   Collection Time: 07/13/23 11:40 AM  Result Value Ref Range   WBC 5.7 3.4 - 10.8 x10E3/uL   RBC 4.61 4.14 - 5.80 x10E6/uL   Hemoglobin 13.4 13.0 - 17.7 g/dL   Hematocrit 13.2 44.0 - 51.0 %   MCV 91 79 - 97 fL   MCH 29.1 26.6 - 33.0 pg   MCHC 32.1 31.5 - 35.7 g/dL   RDW 10.2 72.5 - 36.6 %   Platelets 229 150 - 450 x10E3/uL   Neutrophils 54 Not Estab. %   Lymphs 36 Not Estab. %   Monocytes 8 Not Estab. %   Eos 1 Not Estab. %   Basos 0 Not Estab. %   Neutrophils Absolute 3.0 1.4 - 7.0 x10E3/uL   Lymphocytes Absolute 2.1 0.7 - 3.1 x10E3/uL   Monocytes Absolute 0.5 0.1 - 0.9 x10E3/uL   EOS (ABSOLUTE) 0.1 0.0 - 0.4 x10E3/uL   Basophils Absolute 0.0 0.0 - 0.2 x10E3/uL   Immature Granulocytes 1 Not Estab. %    Immature Grans (Abs) 0.0 0.0 - 0.1 x10E3/uL  Comprehensive metabolic panel     Status: Abnormal   Collection Time: 07/13/23 11:40 AM  Result Value Ref Range   Glucose 115 (H) 70 - 99 mg/dL   BUN 20 8 - 27 mg/dL   Creatinine, Ser 4.40 0.76 - 1.27 mg/dL   eGFR 347 >42 VZ/DGL/8.75   BUN/Creatinine Ratio 24 10 - 24   Sodium 140 134 - 144 mmol/L   Potassium 4.4 3.5 - 5.2 mmol/L   Chloride 100 96 - 106 mmol/L   CO2 22 20 - 29 mmol/L   Calcium 10.1 8.6 - 10.2 mg/dL   Total Protein 6.9 6.0 - 8.5 g/dL   Albumin 4.8 3.9 - 4.9 g/dL   Globulin, Total 2.1 1.5 - 4.5 g/dL   Bilirubin Total 0.5 0.0 - 1.2 mg/dL   Alkaline Phosphatase 55 44 - 121 IU/L   AST 22 0 - 40 IU/L   ALT 30 0 - 44 IU/L  Lipid panel     Status: Abnormal   Collection Time: 07/13/23 11:40 AM  Result Value Ref Range   Cholesterol, Total 181 100 - 199 mg/dL   Triglycerides 643 (H) 0 - 149 mg/dL   HDL 45 >32 mg/dL   VLDL Cholesterol Cal 40 5 - 40 mg/dL   LDL Chol Calc (NIH) 96 0 - 99 mg/dL   Chol/HDL Ratio 4.0 0.0 - 5.0 ratio    Comment:                                   T. Chol/HDL Ratio  Men  Women                               1/2 Avg.Risk  3.4    3.3                                   Avg.Risk  5.0    4.4                                2X Avg.Risk  9.6    7.1                                3X Avg.Risk 23.4   11.0   Hemoglobin A1c     Status: Abnormal   Collection Time: 07/13/23 11:40 AM  Result Value Ref Range   Hgb A1c MFr Bld 7.4 (H) 4.8 - 5.6 %    Comment:          Prediabetes: 5.7 - 6.4          Diabetes: >6.4          Glycemic control for adults with diabetes: <7.0    Est. average glucose Bld gHb Est-mCnc 166 mg/dL  POCT CBG (Fasting - Glucose)     Status: Abnormal   Collection Time: 07/17/23 10:10 AM  Result Value Ref Range   Glucose Fasting, POC 126 (A) 70 - 99 mg/dL      Assessment & Plan:  As per problem list. Stricter low calorie diet, low cholesterol and  low fat diet and exercise as much as possible. Increase Glipizide to 10 mg daily. Problem List Items Addressed This Visit       Cardiovascular and Mediastinum   Primary hypertension   Relevant Medications   olmesartan (BENICAR) 40 MG tablet     Endocrine   Type 2 diabetes mellitus without complication, without long-term current use of insulin (HCC) - Primary   Relevant Medications   olmesartan (BENICAR) 40 MG tablet   glipiZIDE (GLUCOTROL) 5 MG tablet   metFORMIN (GLUCOPHAGE) 1000 MG tablet   Other Relevant Orders   POCT CBG (Fasting - Glucose) (Completed)   Fructosamine   Other Visit Diagnoses       Encounter for general adult medical examination with abnormal findings           Return in about 6 weeks (around 08/28/2023) for fu with labs prior.   Total time spent: 30 minutes  Luna Fuse, MD  07/17/2023   This document may have been prepared by [facility] Voice Recognition software and as such may include unintentional dictation errors.

## 2023-08-15 ENCOUNTER — Other Ambulatory Visit: Payer: Self-pay | Admitting: Internal Medicine

## 2023-08-15 DIAGNOSIS — E119 Type 2 diabetes mellitus without complications: Secondary | ICD-10-CM

## 2023-08-28 ENCOUNTER — Ambulatory Visit: Payer: No Typology Code available for payment source | Admitting: Internal Medicine

## 2023-08-28 VITALS — BP 132/81 | HR 75 | Temp 96.2°F | Ht 67.0 in | Wt 178.0 lb

## 2023-08-28 DIAGNOSIS — E119 Type 2 diabetes mellitus without complications: Secondary | ICD-10-CM

## 2023-08-28 DIAGNOSIS — Z0130 Encounter for examination of blood pressure without abnormal findings: Secondary | ICD-10-CM

## 2023-08-28 DIAGNOSIS — E782 Mixed hyperlipidemia: Secondary | ICD-10-CM | POA: Diagnosis not present

## 2023-08-28 DIAGNOSIS — G723 Periodic paralysis: Secondary | ICD-10-CM

## 2023-08-28 LAB — POCT CBG (FASTING - GLUCOSE)-MANUAL ENTRY: Glucose Fasting, POC: 131 mg/dL — AB (ref 70–99)

## 2023-08-28 MED ORDER — ROSUVASTATIN CALCIUM 20 MG PO TABS: 20.0000 mg | ORAL_TABLET | Freq: Every evening | ORAL | 1 refills | Status: DC

## 2023-08-28 NOTE — Progress Notes (Signed)
Established Patient Office Visit  Subjective:  Patient ID: Robert Avery, male    DOB: March 25, 1961  Age: 63 y.o. MRN: [id number]  Chief Complaint  Patient presents with   Follow-up    6 week follow up  No labs done    C/o periodic episodes of general weakness that resolve spontaneously after 10 minutes over 2-3 days. Last episode yesterday, Not associated with chest pain or SOB. Also here for lab review and medication refills. Failed to have previsit labs done.    No other concerns at this time.   Past Medical History:  Diagnosis Date   Atopic dermatitis    BPH (benign prostatic hyperplasia)    Diabetes mellitus without complication (HCC)    Pt takes Metformin   Fatty liver    GERD (gastroesophageal reflux disease)    Hyperlipidemia 03/22/2016   Hypertension    Lumbosacral radiculopathy at L4     Past Surgical History:  Procedure Laterality Date   BIOPSY  01/22/2023   Procedure: BIOPSY;  Surgeon: Jaynie Collins, DO;  Location: [facility] ENDOSCOPY;  Service: Gastroenterology;;   COLONOSCOPY WITH PROPOFOL N/A 01/22/2023   Procedure: COLONOSCOPY WITH PROPOFOL;  Surgeon: Jaynie Collins, DO;  Location: [facility] ENDOSCOPY;  Service: Gastroenterology;  Laterality: N/A;  NEEDS CHINESE (MANDARIN)   ESOPHAGOGASTRODUODENOSCOPY (EGD) WITH PROPOFOL N/A 03/14/2017   Procedure: ESOPHAGOGASTRODUODENOSCOPY (EGD) WITH PROPOFOL;  Surgeon: Scot Jun, MD;  Location: [facility] ENDOSCOPY;  Service: Endoscopy;  Laterality: N/A;   ESOPHAGOGASTRODUODENOSCOPY (EGD) WITH PROPOFOL N/A 01/22/2023   Procedure: ESOPHAGOGASTRODUODENOSCOPY (EGD) WITH PROPOFOL;  Surgeon: Jaynie Collins, DO;  Location: [facility] ENDOSCOPY;  Service: Gastroenterology;  Laterality: N/A;   POLYPECTOMY  01/22/2023   Procedure: POLYPECTOMY;  Surgeon: Jaynie Collins, DO;  Location: [facility] ENDOSCOPY;  Service: Gastroenterology;;    Social History   Socioeconomic History   Marital status: Married    Spouse name: Not  on file   Number of children: Not on file   Years of education: Not on file   Highest education level: Not on file  Occupational History   Not on file  Tobacco Use   Smoking status: Never   Smokeless tobacco: Never  Vaping Use   Vaping status: Never Used  Substance and Sexual Activity   Alcohol use: No   Drug use: No   Sexual activity: Not on file  Other Topics Concern   Not on file  Social History Narrative   Not on file   Social Drivers of Health   Financial Resource Strain: Not on file  Food Insecurity: Not on file  Transportation Needs: Not on file  Physical Activity: Not on file  Stress: Not on file  Social Connections: Not on file  Intimate Partner Violence: Not on file    No family history on file.  No Known Allergies  Outpatient Medications Prior to Visit  Medication Sig   alfuzosin (UROXATRAL) 10 MG 24 hr tablet TAKE 1 TABLET (10 MG TOTAL) BY MOUTH DAILY WITH BREAKFAST.   diphenoxylate-atropine (LOMOTIL) 2.5-0.025 MG tablet USE AS DIRECTED 1 TAB AFTER EACH BOWEL MOVEMENT, MAX 8 TABS/DAY   dutasteride (AVODART) 0.5 MG capsule Take 1 capsule (0.5 mg total) by mouth daily.   glipiZIDE (GLUCOTROL) 5 MG tablet TAKE 1 TABLET BY MOUTH EVERY DAY BEFORE BREAKFAST   mesalamine (LIALDA) 1.2 g EC tablet Take 2.4 g by mouth daily with breakfast.   metFORMIN (GLUCOPHAGE) 1000 MG tablet Take 1 tablet (1,000 mg total) by mouth 2 (two) times daily.  olmesartan (BENICAR) 40 MG tablet Take 1 tablet (40 mg total) by mouth daily.   pantoprazole (PROTONIX) 40 MG tablet Take 40 mg by mouth 2 (two) times daily.   pioglitazone (ACTOS) 45 MG tablet Take 1 tablet (45 mg total) by mouth daily.   tamsulosin (FLOMAX) 0.4 MG CAPS capsule Take 1 capsule (0.4 mg total) by mouth daily after breakfast.   [DISCONTINUED] rosuvastatin (CRESTOR) 20 MG tablet TAKE ONE TABLET BY MOUTH ONCE DAILY * NOTE INCREASE & D/C NEXLIZET   No facility-administered medications prior to visit.    Review of  Systems  Constitutional:  Positive for weight loss (1 lb).  HENT: Negative.    Eyes: Negative.   Respiratory: Negative.    Cardiovascular: Negative.   Gastrointestinal: Negative.   Genitourinary: Negative.   Skin: Negative.   Neurological: Negative.   Endo/Heme/Allergies: Negative.        Objective:   BP 132/81   Pulse 75   Temp (!) 96.2 F (35.7 C)   Ht 5\' 7"  (1.702 m)   Wt 178 lb (80.7 kg)   SpO2 99%   BMI 27.88 kg/m   Vitals:   08/28/23 1005  BP: 132/81  Pulse: 75  Temp: (!) 96.2 F (35.7 C)  Height: 5\' 7"  (1.702 m)  Weight: 178 lb (80.7 kg)  SpO2: 99%  BMI (Calculated): 27.87    Physical Exam Vitals reviewed.  Constitutional:      Appearance: Normal appearance.  HENT:     Head: Normocephalic.     Left Ear: There is no impacted cerumen.     Nose: Nose normal.     Mouth/Throat:     Mouth: Mucous membranes are moist.     Pharynx: No posterior oropharyngeal erythema.  Eyes:     Extraocular Movements: Extraocular movements intact.     Pupils: Pupils are equal, round, and reactive to light.  Cardiovascular:     Rate and Rhythm: Regular rhythm.     Chest Wall: PMI is not displaced.     Pulses: Normal pulses.     Heart sounds: Normal heart sounds. No murmur heard. Pulmonary:     Effort: Pulmonary effort is normal.     Breath sounds: Normal air entry. No rhonchi or rales.  Abdominal:     General: Abdomen is flat. Bowel sounds are normal. There is no distension.     Palpations: Abdomen is soft. There is no hepatomegaly, splenomegaly or mass.     Tenderness: There is no abdominal tenderness.  Musculoskeletal:        General: Normal range of motion.     Cervical back: Normal range of motion and neck supple.     Right lower leg: No edema.     Left lower leg: No edema.  Skin:    General: Skin is warm and dry.     Comments: Onychomycosis of both big toenails  Neurological:     General: No focal deficit present.     Mental Status: He is alert and  oriented to person, place, and time.     Cranial Nerves: No cranial nerve deficit.     Motor: No weakness (5/5 power globally).     Deep Tendon Reflexes:     Reflex Scores:      Bicep reflexes are 3+ on the right side and 3+ on the left side.      Patellar reflexes are 3+ on the right side and 3+ on the left side. Psychiatric:  Mood and Affect: Mood normal.        Behavior: Behavior normal.      No results found for any visits on 08/28/23.  Recent Results (from the past 2160 hours)  PSA     Status: None   Collection Time: 07/13/23 11:40 AM  Result Value Ref Range   Prostate Specific Ag, Serum 3.1 0.0 - 4.0 ng/mL    Comment: Roche ECLIA methodology. According to the American Urological Association, Serum PSA should decrease and remain at undetectable levels after radical prostatectomy. The AUA defines biochemical recurrence as an initial PSA value 0.2 ng/mL or greater followed by a subsequent confirmatory PSA value 0.2 ng/mL or greater. Values obtained with different assay methods or kits cannot be used interchangeably. Results cannot be interpreted as absolute evidence of the presence or absence of malignant disease.   CBC With Diff/Platelet     Status: None   Collection Time: 07/13/23 11:40 AM  Result Value Ref Range   WBC 5.7 3.4 - 10.8 x10E3/uL   RBC 4.61 4.14 - 5.80 x10E6/uL   Hemoglobin 13.4 13.0 - 17.7 g/dL   Hematocrit 78.2 95.6 - 51.0 %   MCV 91 79 - 97 fL   MCH 29.1 26.6 - 33.0 pg   MCHC 32.1 31.5 - 35.7 g/dL   RDW 21.3 08.6 - 57.8 %   Platelets 229 150 - 450 x10E3/uL   Neutrophils 54 Not Estab. %   Lymphs 36 Not Estab. %   Monocytes 8 Not Estab. %   Eos 1 Not Estab. %   Basos 0 Not Estab. %   Neutrophils Absolute 3.0 1.4 - 7.0 x10E3/uL   Lymphocytes Absolute 2.1 0.7 - 3.1 x10E3/uL   Monocytes Absolute 0.5 0.1 - 0.9 x10E3/uL   EOS (ABSOLUTE) 0.1 0.0 - 0.4 x10E3/uL   Basophils Absolute 0.0 0.0 - 0.2 x10E3/uL   Immature Granulocytes 1 Not Estab. %    Immature Grans (Abs) 0.0 0.0 - 0.1 x10E3/uL  Comprehensive metabolic panel     Status: Abnormal   Collection Time: 07/13/23 11:40 AM  Result Value Ref Range   Glucose 115 (H) 70 - 99 mg/dL   BUN 20 8 - 27 mg/dL   Creatinine, Ser 4.69 0.76 - 1.27 mg/dL   eGFR 629 >52 WU/XLK/4.40   BUN/Creatinine Ratio 24 10 - 24   Sodium 140 134 - 144 mmol/L   Potassium 4.4 3.5 - 5.2 mmol/L   Chloride 100 96 - 106 mmol/L   CO2 22 20 - 29 mmol/L   Calcium 10.1 8.6 - 10.2 mg/dL   Total Protein 6.9 6.0 - 8.5 g/dL   Albumin 4.8 3.9 - 4.9 g/dL   Globulin, Total 2.1 1.5 - 4.5 g/dL   Bilirubin Total 0.5 0.0 - 1.2 mg/dL   Alkaline Phosphatase 55 44 - 121 IU/L   AST 22 0 - 40 IU/L   ALT 30 0 - 44 IU/L  Lipid panel     Status: Abnormal   Collection Time: 07/13/23 11:40 AM  Result Value Ref Range   Cholesterol, Total 181 100 - 199 mg/dL   Triglycerides 102 (H) 0 - 149 mg/dL   HDL 45 >72 mg/dL   VLDL Cholesterol Cal 40 5 - 40 mg/dL   LDL Chol Calc (NIH) 96 0 - 99 mg/dL   Chol/HDL Ratio 4.0 0.0 - 5.0 ratio    Comment:  T. Chol/HDL Ratio                                             Men  Women                               1/2 Avg.Risk  3.4    3.3                                   Avg.Risk  5.0    4.4                                2X Avg.Risk  9.6    7.1                                3X Avg.Risk 23.4   11.0   Hemoglobin A1c     Status: Abnormal   Collection Time: 07/13/23 11:40 AM  Result Value Ref Range   Hgb A1c MFr Bld 7.4 (H) 4.8 - 5.6 %    Comment:          Prediabetes: 5.7 - 6.4          Diabetes: >6.4          Glycemic control for adults with diabetes: <7.0    Est. average glucose Bld gHb Est-mCnc 166 mg/dL  POCT CBG (Fasting - Glucose)     Status: Abnormal   Collection Time: 07/17/23 10:10 AM  Result Value Ref Range   Glucose Fasting, POC 126 (A) 70 - 99 mg/dL      Assessment & Plan:  As per problem list  Problem List Items Addressed This Visit        Endocrine   Type 2 diabetes mellitus without complication, without long-term current use of insulin (HCC) - Primary   Relevant Medications   rosuvastatin (CRESTOR) 20 MG tablet   Other Relevant Orders   POCT CBG (Fasting - Glucose)   Other Visit Diagnoses       Periodic paralysis       Relevant Orders   BMP8+Anion Gap   TSH   T3   T3, free   T4     Mixed hyperlipidemia       Relevant Medications   rosuvastatin (CRESTOR) 20 MG tablet       Return in about 2 weeks (around 09/11/2023) for lab results.   Total time spent: 30 minutes  Luna Fuse, MD  08/28/2023   This document may have been prepared by [facility] Voice Recognition software and as such may include unintentional dictation errors.

## 2023-08-29 LAB — BMP8+ANION GAP
Anion Gap: 13 mmol/L (ref 10.0–18.0)
BUN/Creatinine Ratio: 26 — ABNORMAL HIGH (ref 10–24)
BUN: 22 mg/dL (ref 8–27)
CO2: 24 mmol/L (ref 20–29)
Calcium: 9.6 mg/dL (ref 8.6–10.2)
Chloride: 104 mmol/L (ref 96–106)
Creatinine, Ser: 0.86 mg/dL (ref 0.76–1.27)
Glucose: 114 mg/dL — ABNORMAL HIGH (ref 70–99)
Potassium: 4.5 mmol/L (ref 3.5–5.2)
Sodium: 141 mmol/L (ref 134–144)
eGFR: 99 mL/min/{1.73_m2} (ref >59)

## 2023-08-29 LAB — FRUCTOSAMINE: Fructosamine: 254 umol/L (ref 0–285)

## 2023-08-29 LAB — T3: T3, Total: 133 ng/dL (ref 71–180)

## 2023-08-29 LAB — T3, FREE: T3, Free: 3.6 pg/mL (ref 2.0–4.4)

## 2023-08-29 LAB — T4: T4, Total: 9.7 ug/dL (ref 4.5–12.0)

## 2023-08-29 LAB — TSH: TSH: 1.09 u[IU]/mL (ref 0.450–4.500)

## 2023-09-12 ENCOUNTER — Ambulatory Visit: Payer: No Typology Code available for payment source | Admitting: Internal Medicine

## 2023-09-17 ENCOUNTER — Other Ambulatory Visit: Payer: Self-pay | Admitting: Internal Medicine

## 2023-09-17 DIAGNOSIS — E119 Type 2 diabetes mellitus without complications: Secondary | ICD-10-CM

## 2023-09-18 ENCOUNTER — Encounter: Payer: Self-pay | Admitting: Internal Medicine

## 2023-09-18 ENCOUNTER — Ambulatory Visit: Payer: No Typology Code available for payment source | Admitting: Internal Medicine

## 2023-09-18 VITALS — BP 130/82 | HR 65 | Ht 67.0 in | Wt 174.0 lb

## 2023-09-18 DIAGNOSIS — E119 Type 2 diabetes mellitus without complications: Secondary | ICD-10-CM

## 2023-09-18 DIAGNOSIS — Z0130 Encounter for examination of blood pressure without abnormal findings: Secondary | ICD-10-CM

## 2023-09-18 DIAGNOSIS — E782 Mixed hyperlipidemia: Secondary | ICD-10-CM

## 2023-09-18 LAB — POCT CBG (FASTING - GLUCOSE)-MANUAL ENTRY: Glucose Fasting, POC: 113 mg/dL — AB (ref 70–99)

## 2023-09-18 NOTE — Progress Notes (Signed)
Established Patient Office Visit  Subjective:  Patient ID: Robert Avery, male    DOB: 19-Nov-1960  Age: 63 y.o. MRN: [id number]  No chief complaint on file.   No new complaints, here for lab review and medication refills. Labs reviewed and notable for well controlled diabetes, fructosamine A1c at target. Denies any hypoglycemic episodes and home bg readings have been at target. TSH and CMP also unremarkable but still reports episodes of generalized weakness.      No other concerns at this time.   Past Medical History:  Diagnosis Date   Atopic dermatitis    BPH (benign prostatic hyperplasia)    Diabetes mellitus without complication (HCC)    Pt takes Metformin   Fatty liver    GERD (gastroesophageal reflux disease)    Hyperlipidemia 03/22/2016   Hypertension    Lumbosacral radiculopathy at L4     Past Surgical History:  Procedure Laterality Date   BIOPSY  01/22/2023   Procedure: BIOPSY;  Surgeon: Jaynie Collins, DO;  Location: [facility] ENDOSCOPY;  Service: Gastroenterology;;   COLONOSCOPY WITH PROPOFOL N/A 01/22/2023   Procedure: COLONOSCOPY WITH PROPOFOL;  Surgeon: Jaynie Collins, DO;  Location: [facility] ENDOSCOPY;  Service: Gastroenterology;  Laterality: N/A;  NEEDS CHINESE (MANDARIN)   ESOPHAGOGASTRODUODENOSCOPY (EGD) WITH PROPOFOL N/A 03/14/2017   Procedure: ESOPHAGOGASTRODUODENOSCOPY (EGD) WITH PROPOFOL;  Surgeon: Scot Jun, MD;  Location: [facility] ENDOSCOPY;  Service: Endoscopy;  Laterality: N/A;   ESOPHAGOGASTRODUODENOSCOPY (EGD) WITH PROPOFOL N/A 01/22/2023   Procedure: ESOPHAGOGASTRODUODENOSCOPY (EGD) WITH PROPOFOL;  Surgeon: Jaynie Collins, DO;  Location: [facility] ENDOSCOPY;  Service: Gastroenterology;  Laterality: N/A;   POLYPECTOMY  01/22/2023   Procedure: POLYPECTOMY;  Surgeon: Jaynie Collins, DO;  Location: [facility] ENDOSCOPY;  Service: Gastroenterology;;    Social History   Socioeconomic History   Marital status: Married    Spouse name: Not on  file   Number of children: Not on file   Years of education: Not on file   Highest education level: Not on file  Occupational History   Not on file  Tobacco Use   Smoking status: Never   Smokeless tobacco: Never  Vaping Use   Vaping status: Never Used  Substance and Sexual Activity   Alcohol use: No   Drug use: No   Sexual activity: Not on file  Other Topics Concern   Not on file  Social History Narrative   Not on file   Social Drivers of Health   Financial Resource Strain: Not on file  Food Insecurity: Not on file  Transportation Needs: Not on file  Physical Activity: Not on file  Stress: Not on file  Social Connections: Not on file  Intimate Partner Violence: Not on file    No family history on file.  No Known Allergies  Outpatient Medications Prior to Visit  Medication Sig   alfuzosin (UROXATRAL) 10 MG 24 hr tablet TAKE 1 TABLET (10 MG TOTAL) BY MOUTH DAILY WITH BREAKFAST.   diphenoxylate-atropine (LOMOTIL) 2.5-0.025 MG tablet USE AS DIRECTED 1 TAB AFTER EACH BOWEL MOVEMENT, MAX 8 TABS/DAY   dutasteride (AVODART) 0.5 MG capsule Take 1 capsule (0.5 mg total) by mouth daily.   glipiZIDE (GLUCOTROL) 5 MG tablet TAKE 1 TABLET BY MOUTH EVERY DAY BEFORE BREAKFAST   mesalamine (LIALDA) 1.2 g EC tablet Take 2.4 g by mouth daily with breakfast.   metFORMIN (GLUCOPHAGE) 1000 MG tablet TAKE 1 TABLET BY MOUTH TWICE A DAY   olmesartan (BENICAR) 40 MG tablet Take 1 tablet (40 mg  total) by mouth daily.   pantoprazole (PROTONIX) 40 MG tablet Take 40 mg by mouth 2 (two) times daily.   pioglitazone (ACTOS) 45 MG tablet Take 1 tablet (45 mg total) by mouth daily.   rosuvastatin (CRESTOR) 20 MG tablet Take 1 tablet (20 mg total) by mouth at bedtime.   tamsulosin (FLOMAX) 0.4 MG CAPS capsule Take 1 capsule (0.4 mg total) by mouth daily after breakfast.   No facility-administered medications prior to visit.    Review of Systems  Constitutional:  Positive for weight loss (1 lb).   HENT: Negative.    Eyes: Negative.   Respiratory: Negative.    Cardiovascular: Negative.   Gastrointestinal: Negative.   Genitourinary: Negative.   Skin: Negative.   Neurological: Negative.   Endo/Heme/Allergies: Negative.        Objective:   BP 130/82   Pulse 65   Ht 5\' 7"  (1.702 m)   Wt 174 lb (78.9 kg)   SpO2 97%   BMI 27.25 kg/m   Vitals:   09/18/23 1127  BP: 130/82  Pulse: 65  Height: 5\' 7"  (1.702 m)  Weight: 174 lb (78.9 kg)  SpO2: 97%  BMI (Calculated): 27.25    Physical Exam   Results for orders placed or performed in visit on 09/18/23  POCT CBG (Fasting - Glucose)  Result Value Ref Range   Glucose Fasting, POC 113 (A) 70 - 99 mg/dL    Recent Results (from the past 2160 hours)  PSA     Status: None   Collection Time: 07/13/23 11:40 AM  Result Value Ref Range   Prostate Specific Ag, Serum 3.1 0.0 - 4.0 ng/mL    Comment: Roche ECLIA methodology. According to the American Urological Association, Serum PSA should decrease and remain at undetectable levels after radical prostatectomy. The AUA defines biochemical recurrence as an initial PSA value 0.2 ng/mL or greater followed by a subsequent confirmatory PSA value 0.2 ng/mL or greater. Values obtained with different assay methods or kits cannot be used interchangeably. Results cannot be interpreted as absolute evidence of the presence or absence of malignant disease.   CBC With Diff/Platelet     Status: None   Collection Time: 07/13/23 11:40 AM  Result Value Ref Range   WBC 5.7 3.4 - 10.8 x10E3/uL   RBC 4.61 4.14 - 5.80 x10E6/uL   Hemoglobin 13.4 13.0 - 17.7 g/dL   Hematocrit 16.1 09.6 - 51.0 %   MCV 91 79 - 97 fL   MCH 29.1 26.6 - 33.0 pg   MCHC 32.1 31.5 - 35.7 g/dL   RDW 04.5 40.9 - 81.1 %   Platelets 229 150 - 450 x10E3/uL   Neutrophils 54 Not Estab. %   Lymphs 36 Not Estab. %   Monocytes 8 Not Estab. %   Eos 1 Not Estab. %   Basos 0 Not Estab. %   Neutrophils Absolute 3.0 1.4 - 7.0  x10E3/uL   Lymphocytes Absolute 2.1 0.7 - 3.1 x10E3/uL   Monocytes Absolute 0.5 0.1 - 0.9 x10E3/uL   EOS (ABSOLUTE) 0.1 0.0 - 0.4 x10E3/uL   Basophils Absolute 0.0 0.0 - 0.2 x10E3/uL   Immature Granulocytes 1 Not Estab. %   Immature Grans (Abs) 0.0 0.0 - 0.1 x10E3/uL  Comprehensive metabolic panel     Status: Abnormal   Collection Time: 07/13/23 11:40 AM  Result Value Ref Range   Glucose 115 (H) 70 - 99 mg/dL   BUN 20 8 - 27 mg/dL   Creatinine, Ser 9.14 0.76 -  1.27 mg/dL   eGFR 161 >09 UE/AVW/0.98   BUN/Creatinine Ratio 24 10 - 24   Sodium 140 134 - 144 mmol/L   Potassium 4.4 3.5 - 5.2 mmol/L   Chloride 100 96 - 106 mmol/L   CO2 22 20 - 29 mmol/L   Calcium 10.1 8.6 - 10.2 mg/dL   Total Protein 6.9 6.0 - 8.5 g/dL   Albumin 4.8 3.9 - 4.9 g/dL   Globulin, Total 2.1 1.5 - 4.5 g/dL   Bilirubin Total 0.5 0.0 - 1.2 mg/dL   Alkaline Phosphatase 55 44 - 121 IU/L   AST 22 0 - 40 IU/L   ALT 30 0 - 44 IU/L  Lipid panel     Status: Abnormal   Collection Time: 07/13/23 11:40 AM  Result Value Ref Range   Cholesterol, Total 181 100 - 199 mg/dL   Triglycerides 119 (H) 0 - 149 mg/dL   HDL 45 >14 mg/dL   VLDL Cholesterol Cal 40 5 - 40 mg/dL   LDL Chol Calc (NIH) 96 0 - 99 mg/dL   Chol/HDL Ratio 4.0 0.0 - 5.0 ratio    Comment:                                   T. Chol/HDL Ratio                                             Men  Women                               1/2 Avg.Risk  3.4    3.3                                   Avg.Risk  5.0    4.4                                2X Avg.Risk  9.6    7.1                                3X Avg.Risk 23.4   11.0   Hemoglobin A1c     Status: Abnormal   Collection Time: 07/13/23 11:40 AM  Result Value Ref Range   Hgb A1c MFr Bld 7.4 (H) 4.8 - 5.6 %    Comment:          Prediabetes: 5.7 - 6.4          Diabetes: >6.4          Glycemic control for adults with diabetes: <7.0    Est. average glucose Bld gHb Est-mCnc 166 mg/dL  POCT CBG (Fasting - Glucose)      Status: Abnormal   Collection Time: 07/17/23 10:10 AM  Result Value Ref Range   Glucose Fasting, POC 126 (A) 70 - 99 mg/dL  POCT CBG (Fasting - Glucose)     Status: Abnormal   Collection Time: 08/28/23 10:41 AM  Result Value Ref Range   Glucose Fasting, POC 131 (A) 70 - 99 mg/dL  NWG9+FAOZH Gap  Status: Abnormal   Collection Time: 08/28/23 11:07 AM  Result Value Ref Range   Glucose 114 (H) 70 - 99 mg/dL   BUN 22 8 - 27 mg/dL   Creatinine, Ser 1.61 0.76 - 1.27 mg/dL   eGFR 99 >09 UE/AVW/0.98   BUN/Creatinine Ratio 26 (H) 10 - 24   Sodium 141 134 - 144 mmol/L   Potassium 4.5 3.5 - 5.2 mmol/L   Chloride 104 96 - 106 mmol/L   CO2 24 20 - 29 mmol/L   Anion Gap 13.0 10.0 - 18.0 mmol/L   Calcium 9.6 8.6 - 10.2 mg/dL  TSH     Status: None   Collection Time: 08/28/23 11:07 AM  Result Value Ref Range   TSH 1.090 0.450 - 4.500 uIU/mL  T3     Status: None   Collection Time: 08/28/23 11:07 AM  Result Value Ref Range   T3, Total 133 71 - 180 ng/dL  T3, free     Status: None   Collection Time: 08/28/23 11:07 AM  Result Value Ref Range   T3, Free 3.6 2.0 - 4.4 pg/mL  T4     Status: None   Collection Time: 08/28/23 11:07 AM  Result Value Ref Range   T4, Total 9.7 4.5 - 12.0 ug/dL  Fructosamine     Status: None   Collection Time: 08/28/23 11:07 AM  Result Value Ref Range   Fructosamine 254 0 - 285 umol/L    Comment: Published reference interval for apparently healthy subjects between age 86 and 19 is 28 - 285 umol/L and in a poorly controlled diabetic population is 228 - 563 umol/L with a mean of 396 umol/L.   POCT CBG (Fasting - Glucose)     Status: Abnormal   Collection Time: 09/18/23 11:29 AM  Result Value Ref Range   Glucose Fasting, POC 113 (A) 70 - 99 mg/dL      Assessment & Plan:  As per problem list  Problem List Items Addressed This Visit       Endocrine   Type 2 diabetes mellitus without complication, without long-term current use of insulin (HCC) - Primary    Relevant Orders   POCT CBG (Fasting - Glucose) (Completed)   Other Visit Diagnoses       Mixed hyperlipidemia           Return in about 2 months (around 11/16/2023) for fu with labs prior.   Total time spent: 20 minutes  Luna Fuse, MD  09/18/2023   This document may have been prepared by [facility] Voice Recognition software and as such may include unintentional dictation errors.

## 2023-10-12 ENCOUNTER — Other Ambulatory Visit: Payer: Self-pay | Admitting: Internal Medicine

## 2023-10-12 DIAGNOSIS — I10 Essential (primary) hypertension: Secondary | ICD-10-CM

## 2023-10-16 ENCOUNTER — Other Ambulatory Visit: Payer: Self-pay | Admitting: Internal Medicine

## 2023-10-16 DIAGNOSIS — E119 Type 2 diabetes mellitus without complications: Secondary | ICD-10-CM

## 2023-10-18 ENCOUNTER — Other Ambulatory Visit: Payer: Self-pay

## 2023-10-18 ENCOUNTER — Other Ambulatory Visit: Payer: Self-pay | Admitting: Internal Medicine

## 2023-10-18 DIAGNOSIS — I10 Essential (primary) hypertension: Secondary | ICD-10-CM

## 2023-10-18 DIAGNOSIS — N138 Other obstructive and reflux uropathy: Secondary | ICD-10-CM

## 2023-10-19 ENCOUNTER — Other Ambulatory Visit: Payer: Self-pay

## 2023-10-19 MED ORDER — TAMSULOSIN HCL 0.4 MG PO CAPS: 0.4000 mg | ORAL_CAPSULE | Freq: Every day | ORAL | 0 refills | Status: DC

## 2023-10-22 ENCOUNTER — Other Ambulatory Visit: Payer: Self-pay

## 2023-10-22 DIAGNOSIS — I10 Essential (primary) hypertension: Secondary | ICD-10-CM

## 2023-10-22 MED ORDER — OLMESARTAN MEDOXOMIL 40 MG PO TABS: 40.0000 mg | ORAL_TABLET | Freq: Every day | ORAL | 0 refills | Status: DC

## 2023-11-20 ENCOUNTER — Ambulatory Visit: Payer: No Typology Code available for payment source | Admitting: Internal Medicine

## 2023-11-24 ENCOUNTER — Other Ambulatory Visit: Payer: Self-pay | Admitting: Internal Medicine

## 2023-11-24 DIAGNOSIS — E119 Type 2 diabetes mellitus without complications: Secondary | ICD-10-CM

## 2023-12-04 ENCOUNTER — Other Ambulatory Visit

## 2023-12-04 DIAGNOSIS — E119 Type 2 diabetes mellitus without complications: Secondary | ICD-10-CM

## 2023-12-04 DIAGNOSIS — E782 Mixed hyperlipidemia: Secondary | ICD-10-CM

## 2023-12-05 LAB — COMPREHENSIVE METABOLIC PANEL WITH GFR
ALT: 16 IU/L (ref 0–44)
AST: 15 IU/L (ref 0–40)
Albumin: 4.6 g/dL (ref 3.9–4.9)
Alkaline Phosphatase: 55 IU/L (ref 44–121)
BUN/Creatinine Ratio: 30 — ABNORMAL HIGH (ref 10–24)
BUN: 24 mg/dL (ref 8–27)
Bilirubin Total: 0.4 mg/dL (ref 0.0–1.2)
CO2: 22 mmol/L (ref 20–29)
Calcium: 9.5 mg/dL (ref 8.6–10.2)
Chloride: 106 mmol/L (ref 96–106)
Creatinine, Ser: 0.81 mg/dL (ref 0.76–1.27)
Globulin, Total: 2.2 g/dL (ref 1.5–4.5)
Glucose: 129 mg/dL — ABNORMAL HIGH (ref 70–99)
Potassium: 4.8 mmol/L (ref 3.5–5.2)
Sodium: 142 mmol/L (ref 134–144)
Total Protein: 6.8 g/dL (ref 6.0–8.5)
eGFR: 100 mL/min/{1.73_m2} (ref >59)

## 2023-12-05 LAB — HEMOGLOBIN A1C
Est. average glucose Bld gHb Est-mCnc: 148 mg/dL
Hgb A1c MFr Bld: 6.8 % — ABNORMAL HIGH (ref 4.8–5.6)

## 2023-12-05 LAB — LIPID PANEL
Chol/HDL Ratio: 3.9 ratio (ref 0.0–5.0)
Cholesterol, Total: 147 mg/dL (ref 100–199)
HDL: 38 mg/dL — ABNORMAL LOW (ref >39)
LDL Chol Calc (NIH): 74 mg/dL (ref 0–99)
Triglycerides: 211 mg/dL — ABNORMAL HIGH (ref 0–149)
VLDL Cholesterol Cal: 35 mg/dL (ref 5–40)

## 2023-12-11 ENCOUNTER — Encounter: Payer: Self-pay | Admitting: Internal Medicine

## 2023-12-11 ENCOUNTER — Ambulatory Visit (INDEPENDENT_AMBULATORY_CARE_PROVIDER_SITE_OTHER)

## 2023-12-11 ENCOUNTER — Ambulatory Visit: Payer: Self-pay | Admitting: Internal Medicine

## 2023-12-11 ENCOUNTER — Ambulatory Visit: Admitting: Internal Medicine

## 2023-12-11 VITALS — BP 130/80 | HR 69 | Temp 96.9°F | Ht 67.0 in | Wt 176.8 lb

## 2023-12-11 DIAGNOSIS — S83411A Sprain of medial collateral ligament of right knee, initial encounter: Secondary | ICD-10-CM | POA: Diagnosis not present

## 2023-12-11 DIAGNOSIS — M25561 Pain in right knee: Secondary | ICD-10-CM | POA: Diagnosis not present

## 2023-12-11 DIAGNOSIS — E119 Type 2 diabetes mellitus without complications: Secondary | ICD-10-CM

## 2023-12-11 DIAGNOSIS — E782 Mixed hyperlipidemia: Secondary | ICD-10-CM | POA: Diagnosis not present

## 2023-12-11 DIAGNOSIS — Z0130 Encounter for examination of blood pressure without abnormal findings: Secondary | ICD-10-CM

## 2023-12-11 LAB — GLUCOSE, POCT (MANUAL RESULT ENTRY): POC Glucose: 139 mg/dL — AB (ref 70–99)

## 2023-12-11 MED ORDER — MELOXICAM 15 MG PO TABS: 15.0000 mg | ORAL_TABLET | Freq: Every day | ORAL | 0 refills | Status: DC

## 2023-12-11 MED ORDER — METFORMIN HCL 1000 MG PO TABS: 1000.0000 mg | ORAL_TABLET | Freq: Two times a day (BID) | ORAL | 0 refills | Status: DC

## 2023-12-11 MED ORDER — ACE KNEE BRACE HINGED MISC: 1.0000 | Freq: Every day | 0 refills | Status: DC

## 2023-12-11 NOTE — Progress Notes (Signed)
 Established Patient Office Visit  Subjective:  Patient ID: Robert Avery, male    DOB: 05-13-1961  Age: 63 y.o. MRN: [id number]  Chief Complaint  Patient presents with   Follow-up    2 mo lab results    C/o right knee pain x 3 weeks denies any trauma, moderate severity without any exacerbating factors and hasn't taken otc analgesia. Also here for lab review and medication refills. Labs reviewed and notable for well controlled diabetes, A1c now at target, lipids at target with unremarkable cmp. Denies any hypoglycemic episodes and home bg readings have been at target.     No other concerns at this time.   Past Medical History:  Diagnosis Date   Atopic dermatitis    BPH (benign prostatic hyperplasia)    Diabetes mellitus without complication (HCC)    Pt takes Metformin    Fatty liver    GERD (gastroesophageal reflux disease)    Hyperlipidemia 03/22/2016   Hypertension    Lumbosacral radiculopathy at L4     Past Surgical History:  Procedure Laterality Date   BIOPSY  01/22/2023   Procedure: BIOPSY;  Surgeon: Quintin Buckle, DO;  Location: [facility] ENDOSCOPY;  Service: Gastroenterology;;   COLONOSCOPY WITH PROPOFOL  N/A 01/22/2023   Procedure: COLONOSCOPY WITH PROPOFOL ;  Surgeon: Quintin Buckle, DO;  Location: [facility] ENDOSCOPY;  Service: Gastroenterology;  Laterality: N/A;  NEEDS CHINESE (MANDARIN)   ESOPHAGOGASTRODUODENOSCOPY (EGD) WITH PROPOFOL  N/A 03/14/2017   Procedure: ESOPHAGOGASTRODUODENOSCOPY (EGD) WITH PROPOFOL ;  Surgeon: Cassie Click, MD;  Location: [facility] ENDOSCOPY;  Service: Endoscopy;  Laterality: N/A;   ESOPHAGOGASTRODUODENOSCOPY (EGD) WITH PROPOFOL  N/A 01/22/2023   Procedure: ESOPHAGOGASTRODUODENOSCOPY (EGD) WITH PROPOFOL ;  Surgeon: Quintin Buckle, DO;  Location: [facility] ENDOSCOPY;  Service: Gastroenterology;  Laterality: N/A;   POLYPECTOMY  01/22/2023   Procedure: POLYPECTOMY;  Surgeon: Quintin Buckle, DO;  Location: [facility] ENDOSCOPY;  Service:  Gastroenterology;;    Social History   Socioeconomic History   Marital status: Married    Spouse name: Not on file   Number of children: Not on file   Years of education: Not on file   Highest education level: Not on file  Occupational History   Not on file  Tobacco Use   Smoking status: Never   Smokeless tobacco: Never  Vaping Use   Vaping status: Never Used  Substance and Sexual Activity   Alcohol use: No   Drug use: No   Sexual activity: Not on file  Other Topics Concern   Not on file  Social History Narrative   Not on file   Social Drivers of Health   Financial Resource Strain: Not on file  Food Insecurity: Not on file  Transportation Needs: Not on file  Physical Activity: Not on file  Stress: Not on file  Social Connections: Not on file  Intimate Partner Violence: Not on file    No family history on file.  No Known Allergies  Outpatient Medications Prior to Visit  Medication Sig   diphenoxylate-atropine (LOMOTIL) 2.5-0.025 MG tablet USE AS DIRECTED 1 TAB AFTER EACH BOWEL MOVEMENT, MAX 8 TABS/DAY   glipiZIDE  (GLUCOTROL ) 5 MG tablet TAKE 2 TABLETS (10 MG TOTAL) BY MOUTH DAILY BEFORE BREAKFAST.   mesalamine (LIALDA) 1.2 g EC tablet Take 2.4 g by mouth daily with breakfast.   olmesartan  (BENICAR ) 40 MG tablet Take 1 tablet (40 mg total) by mouth daily.   pantoprazole (PROTONIX) 40 MG tablet Take 40 mg by mouth 2 (two) times daily.   pioglitazone  (ACTOS ) 45  MG tablet TAKE 1 TABLET BY MOUTH EVERY DAY   rosuvastatin  (CRESTOR ) 20 MG tablet Take 1 tablet (20 mg total) by mouth at bedtime.   tamsulosin  (FLOMAX ) 0.4 MG CAPS capsule Take 1 capsule (0.4 mg total) by mouth daily after breakfast.   [DISCONTINUED] metFORMIN  (GLUCOPHAGE ) 1000 MG tablet TAKE 1 TABLET BY MOUTH TWICE A DAY   No facility-administered medications prior to visit.    Review of Systems  Constitutional:  Positive for weight loss (1 lb).  HENT: Negative.    Eyes: Negative.   Respiratory:  Negative.    Cardiovascular: Negative.   Gastrointestinal: Negative.   Genitourinary: Negative.   Musculoskeletal:  Positive for joint pain.  Skin: Negative.   Neurological: Negative.   Endo/Heme/Allergies: Negative.        Objective:   BP 130/80   Pulse 69   Temp (!) 96.9 F (36.1 C)   Ht 5\' 7"  (1.702 m)   Wt 176 lb 12.8 oz (80.2 kg)   SpO2 97%   BMI 27.69 kg/m   Vitals:   12/11/23 1040  BP: 130/80  Pulse: 69  Temp: (!) 96.9 F (36.1 C)  Height: 5\' 7"  (1.702 m)  Weight: 176 lb 12.8 oz (80.2 kg)  SpO2: 97%  BMI (Calculated): 27.68    Physical Exam Musculoskeletal:     Right knee: Crepitus present. No swelling or deformity. Tenderness present over the MCL. MCL laxity present.      Results for orders placed or performed in visit on 12/11/23  POCT Glucose (CBG)  Result Value Ref Range   POC Glucose 139 (A) 70 - 99 mg/dl    Recent Results (from the past 2160 hours)  POCT CBG (Fasting - Glucose)     Status: Abnormal   Collection Time: 09/18/23 11:29 AM  Result Value Ref Range   Glucose Fasting, POC 113 (A) 70 - 99 mg/dL  Comprehensive metabolic panel     Status: Abnormal   Collection Time: 12/04/23 10:38 AM  Result Value Ref Range   Glucose 129 (H) 70 - 99 mg/dL   BUN 24 8 - 27 mg/dL   Creatinine, Ser 8.29 0.76 - 1.27 mg/dL   eGFR 562 >13 YQ/MVH/8.46   BUN/Creatinine Ratio 30 (H) 10 - 24   Sodium 142 134 - 144 mmol/L   Potassium 4.8 3.5 - 5.2 mmol/L   Chloride 106 96 - 106 mmol/L   CO2 22 20 - 29 mmol/L   Calcium  9.5 8.6 - 10.2 mg/dL   Total Protein 6.8 6.0 - 8.5 g/dL   Albumin 4.6 3.9 - 4.9 g/dL   Globulin, Total 2.2 1.5 - 4.5 g/dL   Bilirubin Total 0.4 0.0 - 1.2 mg/dL   Alkaline Phosphatase 55 44 - 121 IU/L   AST 15 0 - 40 IU/L   ALT 16 0 - 44 IU/L  Lipid panel     Status: Abnormal   Collection Time: 12/04/23 10:38 AM  Result Value Ref Range   Cholesterol, Total 147 100 - 199 mg/dL   Triglycerides 962 (H) 0 - 149 mg/dL   HDL 38 (L) >95 mg/dL    VLDL Cholesterol Cal 35 5 - 40 mg/dL   LDL Chol Calc (NIH) 74 0 - 99 mg/dL   Chol/HDL Ratio 3.9 0.0 - 5.0 ratio    Comment:  T. Chol/HDL Ratio                                             Men  Women                               1/2 Avg.Risk  3.4    3.3                                   Avg.Risk  5.0    4.4                                2X Avg.Risk  9.6    7.1                                3X Avg.Risk 23.4   11.0   Hemoglobin A1c     Status: Abnormal   Collection Time: 12/04/23 10:38 AM  Result Value Ref Range   Hgb A1c MFr Bld 6.8 (H) 4.8 - 5.6 %    Comment:          Prediabetes: 5.7 - 6.4          Diabetes: >6.4          Glycemic control for adults with diabetes: <7.0    Est. average glucose Bld gHb Est-mCnc 148 mg/dL  POCT Glucose (CBG)     Status: Abnormal   Collection Time: 12/11/23 10:45 AM  Result Value Ref Range   POC Glucose 139 (A) 70 - 99 mg/dl      Assessment & Plan:  As per problem list.  Problem List Items Addressed This Visit       Endocrine   Type 2 diabetes mellitus without complication, without long-term current use of insulin (HCC) - Primary   Relevant Medications   metFORMIN  (GLUCOPHAGE ) 1000 MG tablet   Elastic Bandages & Supports (ACE KNEE BRACE HINGED) MISC   Other Relevant Orders   POCT Glucose (CBG) (Completed)   Other Visit Diagnoses       Sprain of medial collateral ligament of right knee, initial encounter       Relevant Medications   meloxicam (MOBIC) 15 MG tablet   Other Relevant Orders   DG Knee Complete 4 Views Right       Return in about 3 months (around 03/12/2024) for fu with labs prior.   Total time spent: 20 minutes  Arzella Bitters, MD  12/11/2023   This document may have been prepared by [facility] Voice Recognition software and as such may include unintentional dictation errors.

## 2023-12-12 ENCOUNTER — Ambulatory Visit: Payer: Self-pay | Admitting: Internal Medicine

## 2024-01-23 ENCOUNTER — Other Ambulatory Visit: Payer: Self-pay | Admitting: Internal Medicine

## 2024-01-23 DIAGNOSIS — I10 Essential (primary) hypertension: Secondary | ICD-10-CM

## 2024-01-24 ENCOUNTER — Other Ambulatory Visit: Payer: Self-pay | Admitting: Internal Medicine

## 2024-01-24 DIAGNOSIS — E119 Type 2 diabetes mellitus without complications: Secondary | ICD-10-CM

## 2024-02-22 ENCOUNTER — Other Ambulatory Visit: Payer: Self-pay | Admitting: Internal Medicine

## 2024-02-22 DIAGNOSIS — S83411A Sprain of medial collateral ligament of right knee, initial encounter: Secondary | ICD-10-CM

## 2024-03-13 ENCOUNTER — Other Ambulatory Visit

## 2024-03-13 LAB — HEMOGLOBIN A1C
Est. average glucose Bld gHb Est-mCnc: 131 mg/dL
Hgb A1c MFr Bld: 6.2 % — ABNORMAL HIGH (ref 4.8–5.6)

## 2024-03-14 LAB — LIPID PANEL
Chol/HDL Ratio: 3.1 ratio (ref 0.0–5.0)
Cholesterol, Total: 124 mg/dL (ref 100–199)
HDL: 40 mg/dL (ref >39)
LDL Chol Calc (NIH): 53 mg/dL (ref 0–99)
Triglycerides: 185 mg/dL — ABNORMAL HIGH (ref 0–149)
VLDL Cholesterol Cal: 31 mg/dL (ref 5–40)

## 2024-03-14 LAB — COMPREHENSIVE METABOLIC PANEL WITH GFR
ALT: 20 IU/L (ref 0–44)
AST: 18 IU/L (ref 0–40)
Albumin: 4.7 g/dL (ref 3.9–4.9)
Alkaline Phosphatase: 52 IU/L (ref 44–121)
BUN/Creatinine Ratio: 16 (ref 10–24)
BUN: 15 mg/dL (ref 8–27)
Bilirubin Total: 0.4 mg/dL (ref 0.0–1.2)
CO2: 22 mmol/L (ref 20–29)
Calcium: 9.7 mg/dL (ref 8.6–10.2)
Chloride: 103 mmol/L (ref 96–106)
Creatinine, Ser: 0.91 mg/dL (ref 0.76–1.27)
Globulin, Total: 2 g/dL (ref 1.5–4.5)
Glucose: 121 mg/dL — ABNORMAL HIGH (ref 70–99)
Potassium: 4.5 mmol/L (ref 3.5–5.2)
Sodium: 142 mmol/L (ref 134–144)
Total Protein: 6.7 g/dL (ref 6.0–8.5)
eGFR: 96 mL/min/1.73 (ref >59)

## 2024-03-16 ENCOUNTER — Other Ambulatory Visit: Payer: Self-pay | Admitting: Internal Medicine

## 2024-03-16 DIAGNOSIS — E782 Mixed hyperlipidemia: Secondary | ICD-10-CM

## 2024-03-18 ENCOUNTER — Ambulatory Visit: Admitting: Urology

## 2024-03-18 ENCOUNTER — Encounter: Payer: Self-pay | Admitting: Urology

## 2024-03-18 ENCOUNTER — Ambulatory Visit: Payer: Self-pay | Admitting: Internal Medicine

## 2024-03-18 ENCOUNTER — Encounter: Payer: Self-pay | Admitting: Internal Medicine

## 2024-03-18 ENCOUNTER — Ambulatory Visit: Admitting: Internal Medicine

## 2024-03-18 ENCOUNTER — Other Ambulatory Visit: Payer: Self-pay

## 2024-03-18 VITALS — BP 136/78 | HR 65 | Temp 96.1°F | Ht 67.0 in | Wt 168.8 lb

## 2024-03-18 VITALS — BP 143/84 | HR 67 | Ht 67.0 in | Wt 168.0 lb

## 2024-03-18 DIAGNOSIS — E119 Type 2 diabetes mellitus without complications: Secondary | ICD-10-CM | POA: Diagnosis not present

## 2024-03-18 DIAGNOSIS — R3911 Hesitancy of micturition: Secondary | ICD-10-CM

## 2024-03-18 DIAGNOSIS — E291 Testicular hypofunction: Secondary | ICD-10-CM | POA: Insufficient documentation

## 2024-03-18 DIAGNOSIS — N401 Enlarged prostate with lower urinary tract symptoms: Secondary | ICD-10-CM

## 2024-03-18 DIAGNOSIS — E782 Mixed hyperlipidemia: Secondary | ICD-10-CM | POA: Diagnosis not present

## 2024-03-18 DIAGNOSIS — I951 Orthostatic hypotension: Secondary | ICD-10-CM

## 2024-03-18 DIAGNOSIS — I10 Essential (primary) hypertension: Secondary | ICD-10-CM | POA: Diagnosis not present

## 2024-03-18 DIAGNOSIS — N400 Enlarged prostate without lower urinary tract symptoms: Secondary | ICD-10-CM | POA: Diagnosis not present

## 2024-03-18 LAB — POC CREATINE & ALBUMIN,URINE
Creatinine, POC: 50 mg/dL
Microalbumin Ur, POC: 30 mg/L

## 2024-03-18 LAB — URINALYSIS, COMPLETE
Bilirubin, UA: NEGATIVE
Glucose, UA: NEGATIVE
Ketones, UA: NEGATIVE
Leukocytes,UA: NEGATIVE
Nitrite, UA: NEGATIVE
Protein,UA: NEGATIVE
RBC, UA: NEGATIVE
Specific Gravity, UA: 1.02 (ref 1.005–1.030)
Urobilinogen, Ur: 0.2 mg/dL (ref 0.2–1.0)
pH, UA: 6 (ref 5.0–7.5)

## 2024-03-18 LAB — MICROSCOPIC EXAMINATION

## 2024-03-18 LAB — BLADDER SCAN AMB NON-IMAGING: Scan Result: 0

## 2024-03-18 LAB — POCT CBG (FASTING - GLUCOSE)-MANUAL ENTRY: Glucose Fasting, POC: 127 mg/dL — AB (ref 70–99)

## 2024-03-18 MED ORDER — EMPAGLIFLOZIN 10 MG PO TABS: 10.0000 mg | ORAL_TABLET | Freq: Every day | ORAL | 0 refills | Status: DC

## 2024-03-18 MED ORDER — TAMSULOSIN HCL 0.4 MG PO CAPS: 0.4000 mg | ORAL_CAPSULE | Freq: Every day | ORAL | 0 refills | Status: DC

## 2024-03-18 NOTE — Progress Notes (Signed)
 Established Patient Office Visit  Subjective:  Patient ID: Robert Avery, male    DOB: 09-12-1960  Age: 63 y.o. MRN: [id number]  Chief Complaint  Patient presents with   Follow-up    3 months follow up lab results    C/o several near syncopal episodes while at work last week. No other complaints, here for lab review and medication refills. Labs reviewed and notable for well controlled diabetes, A1c improved and remains at target, lipids at target with unremarkable cmp. Denies any hypoglycemic episodes and home bg readings have been at target.     No other concerns at this time.   Past Medical History:  Diagnosis Date   Atopic dermatitis    BPH (benign prostatic hyperplasia)    Diabetes mellitus without complication (HCC)    Pt takes Metformin    Fatty liver    GERD (gastroesophageal reflux disease)    Hyperlipidemia 03/22/2016   Hypertension    Lumbosacral radiculopathy at L4     Past Surgical History:  Procedure Laterality Date   BIOPSY  01/22/2023   Procedure: BIOPSY;  Surgeon: Onita Elspeth Sharper, DO;  Location: [facility] ENDOSCOPY;  Service: Gastroenterology;;   COLONOSCOPY WITH PROPOFOL  N/A 01/22/2023   Procedure: COLONOSCOPY WITH PROPOFOL ;  Surgeon: Onita Elspeth Sharper, DO;  Location: [facility] ENDOSCOPY;  Service: Gastroenterology;  Laterality: N/A;  NEEDS CHINESE (MANDARIN)   ESOPHAGOGASTRODUODENOSCOPY (EGD) WITH PROPOFOL  N/A 03/14/2017   Procedure: ESOPHAGOGASTRODUODENOSCOPY (EGD) WITH PROPOFOL ;  Surgeon: Viktoria Lamar DASEN, MD;  Location: [facility] ENDOSCOPY;  Service: Endoscopy;  Laterality: N/A;   ESOPHAGOGASTRODUODENOSCOPY (EGD) WITH PROPOFOL  N/A 01/22/2023   Procedure: ESOPHAGOGASTRODUODENOSCOPY (EGD) WITH PROPOFOL ;  Surgeon: Onita Elspeth Sharper, DO;  Location: [facility] ENDOSCOPY;  Service: Gastroenterology;  Laterality: N/A;   POLYPECTOMY  01/22/2023   Procedure: POLYPECTOMY;  Surgeon: Onita Elspeth Sharper, DO;  Location: [facility] ENDOSCOPY;  Service: Gastroenterology;;     Social History   Socioeconomic History   Marital status: Married    Spouse name: Not on file   Number of children: Not on file   Years of education: Not on file   Highest education level: Not on file  Occupational History   Not on file  Tobacco Use   Smoking status: Never   Smokeless tobacco: Never  Vaping Use   Vaping status: Never Used  Substance and Sexual Activity   Alcohol use: No   Drug use: No   Sexual activity: Not on file  Other Topics Concern   Not on file  Social History Narrative   Not on file   Social Drivers of Health   Financial Resource Strain: Not on file  Food Insecurity: Not on file  Transportation Needs: Not on file  Physical Activity: Not on file  Stress: Not on file  Social Connections: Not on file  Intimate Partner Violence: Not on file    No family history on file.  No Known Allergies  Outpatient Medications Prior to Visit  Medication Sig   diphenoxylate-atropine (LOMOTIL) 2.5-0.025 MG tablet USE AS DIRECTED 1 TAB AFTER EACH BOWEL MOVEMENT, MAX 8 TABS/DAY   glipiZIDE  (GLUCOTROL ) 5 MG tablet TAKE 2 TABLETS (10 MG TOTAL) BY MOUTH DAILY BEFORE BREAKFAST.   meloxicam  (MOBIC ) 15 MG tablet TAKE 1 TABLET (15 MG TOTAL) BY MOUTH DAILY.   mesalamine (LIALDA) 1.2 g EC tablet Take 2.4 g by mouth daily with breakfast.   metFORMIN  (GLUCOPHAGE ) 1000 MG tablet Take 1 tablet (1,000 mg total) by mouth 2 (two) times daily.   olmesartan  (BENICAR ) 40 MG tablet  TAKE 1 TABLET BY MOUTH EVERY DAY   pantoprazole (PROTONIX) 40 MG tablet Take 40 mg by mouth 2 (two) times daily.   pioglitazone  (ACTOS ) 45 MG tablet TAKE 1 TABLET BY MOUTH EVERY DAY   rosuvastatin  (CRESTOR ) 20 MG tablet TAKE 1 TABLET BY MOUTH EVERYDAY AT BEDTIME   tamsulosin  (FLOMAX ) 0.4 MG CAPS capsule Take 1 capsule (0.4 mg total) by mouth daily after breakfast.   No facility-administered medications prior to visit.    Review of Systems  Constitutional:  Positive for weight loss (8 lbs).   HENT: Negative.    Eyes: Negative.   Respiratory: Negative.    Cardiovascular: Negative.   Gastrointestinal: Negative.   Genitourinary:        Decreased libido  Musculoskeletal:  Positive for joint pain.  Skin: Negative.   Neurological:  Positive for dizziness.  Endo/Heme/Allergies: Negative.        Objective:   BP 136/78   Pulse 65   Temp (!) 96.1 F (35.6 C) (Tympanic)   Ht 5' 7 (1.702 m)   Wt 168 lb 12.8 oz (76.6 kg)   SpO2 99%   BMI 26.44 kg/m   Vitals:   03/18/24 1026  BP: 136/78  Pulse: 65  Temp: (!) 96.1 F (35.6 C)  Height: 5' 7 (1.702 m)  Weight: 168 lb 12.8 oz (76.6 kg)  SpO2: 99%  TempSrc: Tympanic  BMI (Calculated): 26.43    Physical Exam Vitals reviewed.  Constitutional:      Appearance: Normal appearance.  HENT:     Head: Normocephalic.     Left Ear: There is no impacted cerumen.     Nose: Nose normal.     Mouth/Throat:     Mouth: Mucous membranes are moist.     Pharynx: No posterior oropharyngeal erythema.  Eyes:     Extraocular Movements: Extraocular movements intact.     Pupils: Pupils are equal, round, and reactive to light.  Cardiovascular:     Rate and Rhythm: Regular rhythm.     Chest Wall: PMI is not displaced.     Pulses: Normal pulses.     Heart sounds: Normal heart sounds. No murmur heard. Pulmonary:     Effort: Pulmonary effort is normal.     Breath sounds: Normal air entry. No rhonchi or rales.  Abdominal:     General: Abdomen is flat. Bowel sounds are normal. There is no distension.     Palpations: Abdomen is soft. There is no hepatomegaly, splenomegaly or mass.     Tenderness: There is no abdominal tenderness.  Musculoskeletal:        General: Normal range of motion.     Cervical back: Normal range of motion and neck supple.     Right lower leg: No edema.     Left lower leg: No edema.  Skin:    General: Skin is warm and dry.     Comments: Onychomycosis of both big toenails  Neurological:     General: No focal  deficit present.     Mental Status: He is alert and oriented to person, place, and time.     Cranial Nerves: No cranial nerve deficit.     Motor: No weakness (5/5 power globally).     Deep Tendon Reflexes:     Reflex Scores:      Bicep reflexes are 3+ on the right side and 3+ on the left side.      Patellar reflexes are 3+ on the right side and 3+ on  the left side. Psychiatric:        Mood and Affect: Mood normal.        Behavior: Behavior normal.      Results for orders placed or performed in visit on 03/18/24  POCT CBG (Fasting - Glucose)  Result Value Ref Range   Glucose Fasting, POC 127 (A) 70 - 99 mg/dL  POC CREATINE & ALBUMIN,URINE  Result Value Ref Range   Microalbumin Ur, POC 30 mg/L   Creatinine, POC 50 mg/dL   Albumin/Creatinine Ratio, Urine, POC 30-300   Results for orders placed or performed in visit on 03/18/24  Bladder Scan (Post Void Residual) in office  Result Value Ref Range   Scan Result 0     Recent Results (from the past 2160 hours)  Comprehensive metabolic panel     Status: Abnormal   Collection Time: 03/13/24  9:36 AM  Result Value Ref Range   Glucose 121 (H) 70 - 99 mg/dL   BUN 15 8 - 27 mg/dL   Creatinine, Ser 9.08 0.76 - 1.27 mg/dL   eGFR 96 >40 fO/fpw/8.26   BUN/Creatinine Ratio 16 10 - 24   Sodium 142 134 - 144 mmol/L   Potassium 4.5 3.5 - 5.2 mmol/L   Chloride 103 96 - 106 mmol/L   CO2 22 20 - 29 mmol/L   Calcium  9.7 8.6 - 10.2 mg/dL   Total Protein 6.7 6.0 - 8.5 g/dL   Albumin 4.7 3.9 - 4.9 g/dL   Globulin, Total 2.0 1.5 - 4.5 g/dL   Bilirubin Total 0.4 0.0 - 1.2 mg/dL   Alkaline Phosphatase 52 44 - 121 IU/L   AST 18 0 - 40 IU/L   ALT 20 0 - 44 IU/L  Lipid panel     Status: Abnormal   Collection Time: 03/13/24  9:36 AM  Result Value Ref Range   Cholesterol, Total 124 100 - 199 mg/dL   Triglycerides 814 (H) 0 - 149 mg/dL   HDL 40 >60 mg/dL   VLDL Cholesterol Cal 31 5 - 40 mg/dL   LDL Chol Calc (NIH) 53 0 - 99 mg/dL   Chol/HDL  Ratio 3.1 0.0 - 5.0 ratio    Comment:                                   T. Chol/HDL Ratio                                             Men  Women                               1/2 Avg.Risk  3.4    3.3                                   Avg.Risk  5.0    4.4                                2X Avg.Risk  9.6    7.1  3X Avg.Risk 23.4   11.0   Hemoglobin A1c     Status: Abnormal   Collection Time: 03/13/24  9:36 AM  Result Value Ref Range   Hgb A1c MFr Bld 6.2 (H) 4.8 - 5.6 %    Comment:          Prediabetes: 5.7 - 6.4          Diabetes: >6.4          Glycemic control for adults with diabetes: <7.0    Est. average glucose Bld gHb Est-mCnc 131 mg/dL  Bladder Scan (Post Void Residual) in office     Status: None   Collection Time: 03/18/24  8:52 AM  Result Value Ref Range   Scan Result 0   POCT CBG (Fasting - Glucose)     Status: Abnormal   Collection Time: 03/18/24 10:30 AM  Result Value Ref Range   Glucose Fasting, POC 127 (A) 70 - 99 mg/dL  POC CREATINE & ALBUMIN,URINE     Status: Abnormal   Collection Time: 03/18/24 11:32 AM  Result Value Ref Range   Microalbumin Ur, POC 30 mg/L   Creatinine, POC 50 mg/dL   Albumin/Creatinine Ratio, Urine, POC 30-300       Assessment & Plan:  Robert Avery was seen today for follow-up.  Type 2 diabetes mellitus without complication, without long-term current use of insulin (HCC) -     POCT CBG (Fasting - Glucose) -     Hemoglobin A1c; Standing -     POC CREATINE & ALBUMIN,URINE  Hypogonadism in male -     Testosterone Free with SHBG; Future -     Luteinizing hormone  Primary hypertension  Mixed hyperlipidemia -     Lipid panel; Standing  Orthostasis    Problem List Items Addressed This Visit       Cardiovascular and Mediastinum   Primary hypertension     Endocrine   Type 2 diabetes mellitus without complication, without long-term current use of insulin (HCC) - Primary   Relevant Orders   POCT CBG  (Fasting - Glucose) (Completed)   Hemoglobin A1c   POC CREATINE & ALBUMIN,URINE (Completed)   Hypogonadism in male   Relevant Orders   Testosterone Free with SHBG   LH     Other   Mixed hyperlipidemia   Relevant Orders   Lipid panel   Other Visit Diagnoses       Orthostasis           Return in about 3 months (around 06/18/2024) for fu with labs prior.   Total time spent: 30 minutes  Sherrill Cinderella Perry, MD  03/18/2024   This document may have been prepared by [facility] Voice Recognition software and as such may include unintentional dictation errors.

## 2024-03-18 NOTE — Progress Notes (Signed)
 03/18/2024 8:46 AM   Robert Avery [birth year] [id number]  Referring provider: Albina GORMAN Dine, MD [address] [place],  [place] [address]  Chief Complaint  Patient presents with   Follow-up   Urologic history:  1.  BPH with LUTS Tamsulosin  0.4 mg daily   HPI: Robert Avery is a 63 y.o. male who presents for follow-up.  A [place] interpreter was present via video link.  Last seen 02/23/2022; PCP has been refilling his tamsulosin  Was getting low on medication and discontinued the tamsulosin  1 week ago.  When he does stop the medication after missing 2 days he has significant worsening in his voiding pattern including urinary hesitancy, weak urinary stream and lower abdominal discomfort.  He recently has had mild dysuria No gross hematuria Last PSA December 2024 was stable at 3.8   PMH: Past Medical History:  Diagnosis Date   Atopic dermatitis    BPH (benign prostatic hyperplasia)    Diabetes mellitus without complication (HCC)    Pt takes Metformin    Fatty liver    GERD (gastroesophageal reflux disease)    Hyperlipidemia 03/22/2016   Hypertension    Lumbosacral radiculopathy at L4     Surgical History: Past Surgical History:  Procedure Laterality Date   BIOPSY  01/22/2023   Procedure: BIOPSY;  Surgeon: Onita Elspeth Sharper, DO;  Location: [facility] ENDOSCOPY;  Service: Gastroenterology;;   COLONOSCOPY WITH PROPOFOL  N/A 01/22/2023   Procedure: COLONOSCOPY WITH PROPOFOL ;  Surgeon: Onita Elspeth Sharper, DO;  Location: [facility] ENDOSCOPY;  Service: Gastroenterology;  Laterality: N/A;  NEEDS CHINESE (MANDARIN)   ESOPHAGOGASTRODUODENOSCOPY (EGD) WITH PROPOFOL  N/A 03/14/2017   Procedure: ESOPHAGOGASTRODUODENOSCOPY (EGD) WITH PROPOFOL ;  Surgeon: Viktoria Lamar DASEN, MD;  Location: [facility] ENDOSCOPY;  Service: Endoscopy;  Laterality: N/A;   ESOPHAGOGASTRODUODENOSCOPY (EGD) WITH PROPOFOL  N/A 01/22/2023   Procedure: ESOPHAGOGASTRODUODENOSCOPY (EGD) WITH PROPOFOL ;  Surgeon: Onita Elspeth Sharper, DO;  Location: [facility] ENDOSCOPY;  Service: Gastroenterology;  Laterality: N/A;   POLYPECTOMY  01/22/2023   Procedure: POLYPECTOMY;  Surgeon: Onita Elspeth Sharper, DO;  Location: [facility] ENDOSCOPY;  Service: Gastroenterology;;    Home Medications:  Allergies as of 03/18/2024   No Known Allergies      Medication List        Accurate as of March 18, 2024  8:46 AM. If you have any questions, ask your nurse or doctor.          diphenoxylate-atropine 2.5-0.025 MG tablet Commonly known as: LOMOTIL USE AS DIRECTED 1 TAB AFTER EACH BOWEL MOVEMENT, MAX 8 TABS/DAY   glipiZIDE  5 MG tablet Commonly known as: GLUCOTROL  TAKE 2 TABLETS (10 MG TOTAL) BY MOUTH DAILY BEFORE BREAKFAST.   meloxicam  15 MG tablet Commonly known as: MOBIC  TAKE 1 TABLET (15 MG TOTAL) BY MOUTH DAILY.   mesalamine 1.2 g EC tablet Commonly known as: LIALDA Take 2.4 g by mouth daily with breakfast.   metFORMIN  1000 MG tablet Commonly known as: GLUCOPHAGE  Take 1 tablet (1,000 mg total) by mouth 2 (two) times daily.   olmesartan  40 MG tablet Commonly known as: BENICAR  TAKE 1 TABLET BY MOUTH EVERY DAY   pantoprazole 40 MG tablet Commonly known as: PROTONIX Take 40 mg by mouth 2 (two) times daily.   pioglitazone  45 MG tablet Commonly known as: ACTOS  TAKE 1 TABLET BY MOUTH EVERY DAY   rosuvastatin  20 MG tablet Commonly known as: CRESTOR  TAKE 1 TABLET BY MOUTH EVERYDAY AT BEDTIME   tamsulosin  0.4 MG Caps capsule Commonly known as: FLOMAX  Take 1 capsule (0.4 mg total) by  mouth daily after breakfast.        Allergies: No Known Allergies  Family History: History reviewed. No pertinent family history.  Social History:  reports that he has never smoked. He has never used smokeless tobacco. He reports that he does not drink alcohol and does not use drugs.   Physical Exam: BP (!) 143/84 (BP Location: Left Arm, Patient Position: Sitting)   Pulse 67   Ht 5' 7 (1.702 m)   Wt 168 lb (76.2 kg)    BMI 26.31 kg/m   Constitutional:  Alert, No acute distress. HEENT: [place] AT Respiratory: Normal respiratory effort, no increased work of breathing. Psychiatric: Normal mood and affect.   Assessment & Plan:    1.  BPH with LUTS He may be interested in surgical options.  If interested in proceeding we will need to schedule cystoscopy and TRUS prostate for volume He would like to continue tamsulosin  for now and if he elects to pursue surgery will call back for these appointments Urinalysis ordered PVR today was 0 mL 1 year follow-up or earlier for worsening lower urinary tract symptoms   Robert JAYSON Barba, MD  [facility] [address] [place], [place] [address] [phone]

## 2024-03-24 ENCOUNTER — Other Ambulatory Visit: Payer: Self-pay

## 2024-03-24 MED ORDER — DAPAGLIFLOZIN PROPANEDIOL 5 MG PO TABS: 5.0000 mg | ORAL_TABLET | Freq: Every day | ORAL | 2 refills | Status: DC

## 2024-03-28 ENCOUNTER — Other Ambulatory Visit: Payer: Self-pay

## 2024-04-25 ENCOUNTER — Other Ambulatory Visit: Payer: Self-pay | Admitting: Internal Medicine

## 2024-04-25 DIAGNOSIS — I10 Essential (primary) hypertension: Secondary | ICD-10-CM

## 2024-04-29 ENCOUNTER — Other Ambulatory Visit: Payer: Self-pay | Admitting: Internal Medicine

## 2024-04-29 DIAGNOSIS — E119 Type 2 diabetes mellitus without complications: Secondary | ICD-10-CM

## 2024-06-08 ENCOUNTER — Other Ambulatory Visit: Payer: Self-pay | Admitting: Urology

## 2024-06-13 ENCOUNTER — Other Ambulatory Visit

## 2024-06-13 DIAGNOSIS — E119 Type 2 diabetes mellitus without complications: Secondary | ICD-10-CM

## 2024-06-13 DIAGNOSIS — E291 Testicular hypofunction: Secondary | ICD-10-CM

## 2024-06-13 DIAGNOSIS — E782 Mixed hyperlipidemia: Secondary | ICD-10-CM

## 2024-06-17 ENCOUNTER — Encounter: Payer: Self-pay | Admitting: Internal Medicine

## 2024-06-17 ENCOUNTER — Ambulatory Visit: Admitting: Internal Medicine

## 2024-06-17 VITALS — BP 126/80 | HR 68 | Temp 97.9°F | Ht 67.0 in | Wt 167.8 lb

## 2024-06-17 DIAGNOSIS — N401 Enlarged prostate with lower urinary tract symptoms: Secondary | ICD-10-CM

## 2024-06-17 DIAGNOSIS — Z23 Encounter for immunization: Secondary | ICD-10-CM | POA: Diagnosis not present

## 2024-06-17 DIAGNOSIS — N138 Other obstructive and reflux uropathy: Secondary | ICD-10-CM

## 2024-06-17 DIAGNOSIS — E782 Mixed hyperlipidemia: Secondary | ICD-10-CM | POA: Diagnosis not present

## 2024-06-17 DIAGNOSIS — I10 Essential (primary) hypertension: Secondary | ICD-10-CM | POA: Diagnosis not present

## 2024-06-17 DIAGNOSIS — E1165 Type 2 diabetes mellitus with hyperglycemia: Secondary | ICD-10-CM

## 2024-06-17 DIAGNOSIS — E119 Type 2 diabetes mellitus without complications: Secondary | ICD-10-CM

## 2024-06-17 DIAGNOSIS — S83411A Sprain of medial collateral ligament of right knee, initial encounter: Secondary | ICD-10-CM

## 2024-06-17 LAB — POCT CBG (FASTING - GLUCOSE)-MANUAL ENTRY: Glucose Fasting, POC: 96 mg/dL (ref 70–99)

## 2024-06-17 MED ORDER — DAPAGLIFLOZIN PROPANEDIOL 5 MG PO TABS: 5.0000 mg | ORAL_TABLET | Freq: Every day | ORAL | 2 refills | Status: AC

## 2024-06-17 MED ORDER — PIOGLITAZONE HCL 45 MG PO TABS: 45.0000 mg | ORAL_TABLET | Freq: Every day | ORAL | 1 refills | Status: AC

## 2024-06-17 MED ORDER — MELOXICAM 15 MG PO TABS: 15.0000 mg | ORAL_TABLET | Freq: Every day | ORAL | 2 refills | Status: AC

## 2024-06-17 NOTE — Progress Notes (Signed)
 Established Patient Office Visit  Subjective:  Patient ID: Robert Avery, male    DOB: 21-Aug-1960  Age: 63 y.o. MRN: [id number]  Chief Complaint  Patient presents with  . Follow-up    3 month lab results    No new complaints, here for lab review and medication refills. Labs reviewed and notable for well controlled diabetes, A1c higher but still at target, lipids at target while testosterone results are pending. Denies any hypoglycemic episodes and home bg readings have been at target.     No other concerns at this time.   Past Medical History:  Diagnosis Date  . Atopic dermatitis   . BPH (benign prostatic hyperplasia)   . Diabetes mellitus without complication (HCC)    Pt takes Metformin   . Fatty liver   . GERD (gastroesophageal reflux disease)   . Hyperlipidemia 03/22/2016  . Hypertension   . Lumbosacral radiculopathy at L4     Past Surgical History:  Procedure Laterality Date  . BIOPSY  01/22/2023   Procedure: BIOPSY;  Surgeon: Onita Elspeth Sharper, DO;  Location: [facility] ENDOSCOPY;  Service: Gastroenterology;;  . COLONOSCOPY WITH PROPOFOL  N/A 01/22/2023   Procedure: COLONOSCOPY WITH PROPOFOL ;  Surgeon: Onita Elspeth Sharper, DO;  Location: [facility] ENDOSCOPY;  Service: Gastroenterology;  Laterality: N/A;  NEEDS CHINESE (MANDARIN)  . ESOPHAGOGASTRODUODENOSCOPY (EGD) WITH PROPOFOL  N/A 03/14/2017   Procedure: ESOPHAGOGASTRODUODENOSCOPY (EGD) WITH PROPOFOL ;  Surgeon: Viktoria Lamar DASEN, MD;  Location: [facility] ENDOSCOPY;  Service: Endoscopy;  Laterality: N/A;  . ESOPHAGOGASTRODUODENOSCOPY (EGD) WITH PROPOFOL  N/A 01/22/2023   Procedure: ESOPHAGOGASTRODUODENOSCOPY (EGD) WITH PROPOFOL ;  Surgeon: Onita Elspeth Sharper, DO;  Location: [facility] ENDOSCOPY;  Service: Gastroenterology;  Laterality: N/A;  . POLYPECTOMY  01/22/2023   Procedure: POLYPECTOMY;  Surgeon: Onita Elspeth Sharper, DO;  Location: [facility] ENDOSCOPY;  Service: Gastroenterology;;    Social History   Socioeconomic History  .  Marital status: Married    Spouse name: Not on file  . Number of children: Not on file  . Years of education: Not on file  . Highest education level: Not on file  Occupational History  . Not on file  Tobacco Use  . Smoking status: Never  . Smokeless tobacco: Never  Vaping Use  . Vaping status: Never Used  Substance and Sexual Activity  . Alcohol use: No  . Drug use: No  . Sexual activity: Not on file  Other Topics Concern  . Not on file  Social History Narrative  . Not on file   Social Drivers of Health   Financial Resource Strain: Not on file  Food Insecurity: Not on file  Transportation Needs: Not on file  Physical Activity: Not on file  Stress: Not on file  Social Connections: Not on file  Intimate Partner Violence: Not on file    No family history on file.  No Known Allergies  Outpatient Medications Prior to Visit  Medication Sig  . diphenoxylate-atropine (LOMOTIL) 2.5-0.025 MG tablet USE AS DIRECTED 1 TAB AFTER EACH BOWEL MOVEMENT, MAX 8 TABS/DAY  . glipiZIDE  (GLUCOTROL ) 5 MG tablet TAKE 2 TABLETS (10 MG TOTAL) BY MOUTH DAILY BEFORE BREAKFAST.  SABRA mesalamine (LIALDA) 1.2 g EC tablet Take 2.4 g by mouth daily with breakfast.  . metFORMIN  (GLUCOPHAGE ) 1000 MG tablet TAKE 1 TABLET BY MOUTH TWICE A DAY  . olmesartan  (BENICAR ) 40 MG tablet TAKE 1 TABLET BY MOUTH EVERY DAY  . pantoprazole (PROTONIX) 40 MG tablet Take 40 mg by mouth 2 (two) times daily.  . rosuvastatin  (CRESTOR ) 20 MG tablet  TAKE 1 TABLET BY MOUTH EVERYDAY AT BEDTIME  . tamsulosin  (FLOMAX ) 0.4 MG CAPS capsule TAKE 1 CAPSULE (0.4 MG TOTAL) BY MOUTH DAILY AFTER BREAKFAST.  . [DISCONTINUED] dapagliflozin  propanediol (FARXIGA ) 5 MG TABS tablet Take 1 tablet (5 mg total) by mouth daily before breakfast.  . [DISCONTINUED] pioglitazone  (ACTOS ) 45 MG tablet TAKE 1 TABLET BY MOUTH EVERY DAY   No facility-administered medications prior to visit.    Review of Systems  Constitutional:  Positive for weight loss  (8 lbs).  HENT: Negative.    Eyes: Negative.   Respiratory: Negative.    Cardiovascular: Negative.   Gastrointestinal: Negative.   Genitourinary:        Decreased libido  Musculoskeletal:  Positive for joint pain (right knee which is improving).  Skin: Negative.   Neurological:  Positive for dizziness.  Endo/Heme/Allergies: Negative.        Objective:   BP 126/80   Pulse 68   Temp 97.9 F (36.6 C)   Ht 5' 7 (1.702 m)   Wt 167 lb 12.8 oz (76.1 kg)   SpO2 98%   BMI 26.28 kg/m   Vitals:   06/17/24 1003  BP: 126/80  Pulse: 68  Temp: 97.9 F (36.6 C)  Height: 5' 7 (1.702 m)  Weight: 167 lb 12.8 oz (76.1 kg)  SpO2: 98%  BMI (Calculated): 26.28    Physical Exam Vitals reviewed.  Constitutional:      Appearance: Normal appearance.  HENT:     Head: Normocephalic.     Left Ear: There is no impacted cerumen.     Nose: Nose normal.     Mouth/Throat:     Mouth: Mucous membranes are moist.     Pharynx: No posterior oropharyngeal erythema.  Eyes:     Extraocular Movements: Extraocular movements intact.     Pupils: Pupils are equal, round, and reactive to light.  Cardiovascular:     Rate and Rhythm: Regular rhythm.     Chest Wall: PMI is not displaced.     Pulses: Normal pulses.     Heart sounds: Normal heart sounds. No murmur heard. Pulmonary:     Effort: Pulmonary effort is normal.     Breath sounds: Normal air entry. No rhonchi or rales.  Abdominal:     General: Abdomen is flat. Bowel sounds are normal. There is no distension.     Palpations: Abdomen is soft. There is no hepatomegaly, splenomegaly or mass.     Tenderness: There is no abdominal tenderness.  Musculoskeletal:        General: Normal range of motion.     Cervical back: Normal range of motion and neck supple.     Right lower leg: No edema.     Left lower leg: No edema.  Skin:    General: Skin is warm and dry.     Comments: Onychomycosis of both big toenails  Neurological:     General: No  focal deficit present.     Mental Status: He is alert and oriented to person, place, and time.     Cranial Nerves: No cranial nerve deficit.     Motor: No weakness (5/5 power globally).     Deep Tendon Reflexes:     Reflex Scores:      Bicep reflexes are 3+ on the right side and 3+ on the left side.      Patellar reflexes are 3+ on the right side and 3+ on the left side. Psychiatric:  Mood and Affect: Mood normal.        Behavior: Behavior normal.      Results for orders placed or performed in visit on 06/17/24  POCT CBG (Fasting - Glucose)  Result Value Ref Range   Glucose Fasting, POC 96 70 - 99 mg/dL    Recent Results (from the past 2160 hours)  POCT CBG (Fasting - Glucose)     Status: Normal   Collection Time: 06/17/24 10:06 AM  Result Value Ref Range   Glucose Fasting, POC 96 70 - 99 mg/dL      Assessment & Plan:  Kidus was seen today for follow-up.  Type 2 diabetes mellitus without complication, without long-term current use of insulin (HCC) -     POCT CBG (Fasting - Glucose) -     Dapagliflozin  Propanediol; Take 1 tablet (5 mg total) by mouth daily before breakfast.  Dispense: 30 tablet; Refill: 2 -     Pioglitazone  HCl; Take 1 tablet (45 mg total) by mouth daily.  Dispense: 90 tablet; Refill: 1 -     Hemoglobin A1c  Primary hypertension  Mixed hyperlipidemia -     Lipid panel  BPH with obstruction/lower urinary tract symptoms  Sprain of medial collateral ligament of right knee, initial encounter -     Meloxicam ; Take 1 tablet (15 mg total) by mouth daily.  Dispense: 30 tablet; Refill: 2    Problem List Items Addressed This Visit       Cardiovascular and Mediastinum   Primary hypertension     Endocrine   Type 2 diabetes mellitus without complication, without long-term current use of insulin (HCC) - Primary   Relevant Medications   dapagliflozin  propanediol (FARXIGA ) 5 MG TABS tablet   pioglitazone  (ACTOS ) 45 MG tablet   Other Relevant Orders    POCT CBG (Fasting - Glucose) (Completed)     Genitourinary   BPH with obstruction/lower urinary tract symptoms     Other   Mixed hyperlipidemia   Other Visit Diagnoses       Sprain of medial collateral ligament of right knee, initial encounter       Relevant Medications   meloxicam  (MOBIC ) 15 MG tablet       Return in about 3 months (around 09/17/2024) for fu with labs prior.   Total time spent: 20 minutes. This time includes review of previous notes and results and patient face to face interaction during today'Analilia Geddis visit.    Sherrill Cinderella Perry, MD  06/17/2024   This document may have been prepared by [facility] Voice Recognition software and as such may include unintentional dictation errors.

## 2024-07-02 LAB — LIPID PANEL
Chol/HDL Ratio: 2.3 ratio (ref 0.0–5.0)
Cholesterol, Total: 85 mg/dL — ABNORMAL LOW (ref 100–199)
HDL: 37 mg/dL — ABNORMAL LOW (ref >39)
LDL Chol Calc (NIH): 33 mg/dL (ref 0–99)
Triglycerides: 64 mg/dL (ref 0–149)
VLDL Cholesterol Cal: 15 mg/dL (ref 5–40)

## 2024-07-02 LAB — TESTOSTERONE, FREE AND TOTAL (INCLUDES SHBG)-(MALES)
% Free Testosterone: 3.3 %
Free Testosterone, S: 51 pg/mL — ABNORMAL LOW
Sex Hormone Binding Globulin: 5.8 nmol/L — ABNORMAL LOW
Testosterone, Serum (Total): 155 ng/dL — ABNORMAL LOW

## 2024-07-02 LAB — HEMOGLOBIN A1C
Est. average glucose Bld gHb Est-mCnc: 137 mg/dL
Hgb A1c MFr Bld: 6.4 % — ABNORMAL HIGH (ref 4.8–5.6)

## 2024-07-09 MED ORDER — TESTOSTERONE 20.25 MG/ACT (1.62%) TD GEL: 2.0000 | Freq: Every day | TRANSDERMAL | 2 refills | Status: AC

## 2024-07-09 NOTE — Addendum Note (Signed)
 Addended by: BRYCE MARKER AHMAD on: 07/09/2024 10:14 AM   Modules accepted: Orders

## 2024-07-09 NOTE — Progress Notes (Signed)
 Patient notified

## 2024-08-02 ENCOUNTER — Other Ambulatory Visit: Payer: Self-pay | Admitting: Internal Medicine

## 2024-08-02 DIAGNOSIS — E119 Type 2 diabetes mellitus without complications: Secondary | ICD-10-CM

## 2024-08-10 ENCOUNTER — Encounter: Payer: Self-pay | Admitting: Internal Medicine

## 2024-09-02 ENCOUNTER — Other Ambulatory Visit: Payer: Self-pay | Admitting: Internal Medicine

## 2024-09-02 DIAGNOSIS — E119 Type 2 diabetes mellitus without complications: Secondary | ICD-10-CM

## 2024-09-23 ENCOUNTER — Ambulatory Visit: Admitting: Internal Medicine

## 2025-03-17 ENCOUNTER — Ambulatory Visit: Admitting: Urology
